# Patient Record
Sex: Male | Born: 1975
Health system: Southern US, Community
[De-identification: ages and names within clinical notes are randomized; demographics above are authoritative.]

## PROBLEM LIST (undated history)

## (undated) DIAGNOSIS — D571 Sickle-cell disease without crisis: Secondary | ICD-10-CM

## (undated) DIAGNOSIS — Z5189 Encounter for other specified aftercare: Secondary | ICD-10-CM

## (undated) DIAGNOSIS — I10 Essential (primary) hypertension: Secondary | ICD-10-CM

## (undated) HISTORY — DX: Encounter for other specified aftercare: Z51.89

---

## 2014-01-08 ENCOUNTER — Encounter (HOSPITAL_COMMUNITY): Payer: Self-pay | Admitting: Emergency Medicine

## 2014-01-08 ENCOUNTER — Emergency Department (INDEPENDENT_AMBULATORY_CARE_PROVIDER_SITE_OTHER)
Admission: EM | Admit: 2014-01-08 | Discharge: 2014-01-08 | Disposition: A | Discharge status: Home / Self Care | Payer: Self-pay | Source: Home / Self Care

## 2014-01-08 DIAGNOSIS — J029 Acute pharyngitis, unspecified: Secondary | ICD-10-CM

## 2014-01-08 HISTORY — DX: Sickle-cell disease without crisis: D57.1

## 2014-01-08 LAB — POCT RAPID STREP A: STREPTOCOCCUS, GROUP A SCREEN (DIRECT): NEGATIVE

## 2014-01-08 MED ORDER — AMOXICILLIN 500 MG PO CAPS: 1000.0000 mg | ORAL_CAPSULE | Freq: Two times a day (BID) | ORAL | Status: DC

## 2014-01-08 NOTE — ED Provider Notes (Signed)
CSN: [id number]     Arrival date & time 01/08/14  1540 History   First MD Initiated Contact with Patient 01/08/14 1737     Chief Complaint  Patient presents with  . Sore Throat   (Consider location/radiation/quality/duration/timing/severity/associated sxs/prior Treatment) HPI Comments: Sore throat for 3 days.   Past Medical History  Diagnosis Date  . Sickle cell anemia    History reviewed. No pertinent past surgical history. History reviewed. No pertinent family history. History  Substance Use Topics  . Smoking status: Not on file  . Smokeless tobacco: Not on file  . Alcohol Use: No    Review of Systems  Constitutional: Positive for activity change, appetite change and fatigue. Negative for fever.  HENT: Positive for sore throat. Negative for congestion, ear pain, postnasal drip and trouble swallowing.   Respiratory: Negative.   Cardiovascular: Negative.   Gastrointestinal: Negative.   Genitourinary: Negative.   Skin: Negative for rash.  Neurological: Negative.     Allergies  Review of patient's allergies indicates no known allergies.  Home Medications   Prior to Admission medications   Not on File   BP 125/85  Pulse 70  Temp(Src) 98.9 F (37.2 C) (Oral)  Resp 14  SpO2 98% Physical Exam  Nursing note and vitals reviewed. Constitutional: He is oriented to person, place, and time. He appears well-developed and well-nourished. No distress.  HENT:  Mouth/Throat: No oropharyngeal exudate.  Bilateral TMs are normal Oropharynx is beefy red with mild swelling particularly of the left palatine arch. no evidence of abscess formation.  Eyes: Conjunctivae and EOM are normal.  Neck: Normal range of motion. Neck supple.  Cardiovascular: Normal rate and normal heart sounds.   Pulmonary/Chest: Effort normal and breath sounds normal. No respiratory distress.  Musculoskeletal: He exhibits no edema.  Lymphadenopathy:    He has cervical adenopathy.  Neurological: He is  alert and oriented to person, place, and time.  Skin: Skin is warm and dry.  Psychiatric: He has a normal mood and affect.    ED Course  Procedures (including critical care time) Labs Review Labs Reviewed  POCT RAPID STREP A ([place])    Results for orders placed during the hospital encounter of 01/08/14  POCT RAPID STREP A ([facility] ONLY)      Result Value Ref Range   Streptococcus, Group A Screen (Direct) NEGATIVE  NEGATIVE   Imaging Review No results found.   MDM   1. Pharyngitis     Suspect strep Amoxil 1 gm bid Ibuprofen 600 q 6h prn Plenty of fluids cepacol loz    Janne Napoleon, NP 01/08/14 1802

## 2014-01-08 NOTE — ED Notes (Signed)
Pt  Reports  sorethroat            With  Symptoms   X  3  Days  Reports  Hurts  To  Swallow  As  Well  -  Pt  Sitting  Upright  On  Exam table  Speaking in  Complete  sentances  And  Is  In no  Acute  Distress

## 2014-01-08 NOTE — Discharge Instructions (Signed)
Pharyngitis Pharyngitis is a sore throat (pharynx). There is redness, pain, and swelling of your throat. HOME CARE   Drink enough fluids to keep your pee (urine) clear or pale yellow.  Only take medicine as told by your doctor.  You may get sick again if you do not take medicine as told. Finish your medicines, even if you start to feel better.  Do not take aspirin.  Rest.  Rinse your mouth (gargle) with salt water ( tsp of salt per 1 qt of water) every 1 2 hours. This will help the pain.  If you are not at risk for choking, you can suck on hard candy or sore throat lozenges. GET HELP IF:  You have large, tender lumps on your neck.  You have a rash.  You cough up green, yellow-brown, or bloody spit. GET HELP RIGHT AWAY IF:   You have a stiff neck.  You drool or cannot swallow liquids.  You throw up (vomit) or are not able to keep medicine or liquids down.  You have very bad pain that does not go away with medicine.  You have problems breathing (not from a stuffy nose). MAKE SURE YOU:   Understand these instructions.  Will watch your condition.  Will get help right away if you are not doing well or get worse. Document Released: 02/24/2008 Document Revised: 06/28/2013 Document Reviewed: 05/15/2013 [facility] Patient Information 2014 [place].  Sore Throat A sore throat is pain, burning, irritation, or scratchiness of the throat. There is often pain or tenderness when swallowing or talking. A sore throat may be accompanied by other symptoms, such as coughing, sneezing, fever, and swollen neck glands. A sore throat is often the first sign of another sickness, such as a cold, flu, strep throat, or mononucleosis (commonly known as mono). Most sore throats go away without medical treatment. CAUSES  The most common causes of a sore throat include:  A viral infection, such as a cold, flu, or mono.  A bacterial infection, such as strep throat, tonsillitis, or whooping  cough.  Seasonal allergies.  Dryness in the air.  Irritants, such as smoke or pollution.  Gastroesophageal reflux disease (GERD). HOME CARE INSTRUCTIONS   Only take over-the-counter medicines as directed by your caregiver.  Drink enough fluids to keep your urine clear or pale yellow.  Rest as needed.  Try using throat sprays, lozenges, or sucking on hard candy to ease any pain (if older than 4 years or as directed).  Sip warm liquids, such as broth, herbal tea, or warm water with honey to relieve pain temporarily. You may also eat or drink cold or frozen liquids such as frozen ice pops.  Gargle with salt water (mix 1 tsp salt with 8 oz of water).  Do not smoke and avoid secondhand smoke.  Put a cool-mist humidifier in your bedroom at night to moisten the air. You can also turn on a hot shower and sit in the bathroom with the door closed for 5 10 minutes. SEEK IMMEDIATE MEDICAL CARE IF:  You have difficulty breathing.  You are unable to swallow fluids, soft foods, or your saliva.  You have increased swelling in the throat.  Your sore throat does not get better in 7 days.  You have nausea and vomiting.  You have a fever or persistent symptoms for more than 2 3 days.  You have a fever and your symptoms suddenly get worse. MAKE SURE YOU:   Understand these instructions.  Will watch your  condition.  Will get help right away if you are not doing well or get worse. Document Released: 10/15/2004 Document Revised: 08/24/2012 Document Reviewed: 05/15/2012 [facility] Patient Information 2014 [place], [place].  Strep Throat Strep throat is an infection of the throat caused by a bacteria named Streptococcus pyogenes. Your caregiver may call the infection streptococcal "tonsillitis" or "pharyngitis" depending on whether there are signs of inflammation in the tonsils or back of the throat. Strep throat is most common in children aged 5 15 years during the cold months of the year,  but it can occur in people of any age during any season. This infection is spread from person to person (contagious) through coughing, sneezing, or other close contact. SYMPTOMS   Fever or chills.  Painful, swollen, red tonsils or throat.  Pain or difficulty when swallowing.  White or yellow spots on the tonsils or throat.  Swollen, tender lymph nodes or "glands" of the neck or under the jaw.  Red rash all over the body (rare). DIAGNOSIS  Many different infections can cause the same symptoms. A test must be done to confirm the diagnosis so the right treatment can be given. A "rapid strep test" can help your caregiver make the diagnosis in a few minutes. If this test is not available, a light swab of the infected area can be used for a throat culture test. If a throat culture test is done, results are usually available in a day or two. TREATMENT  Strep throat is treated with antibiotic medicine. HOME CARE INSTRUCTIONS   Gargle with 1 tsp of salt in 1 cup of warm water, 3 4 times per day or as needed for comfort.  Family members who also have a sore throat or fever should be tested for strep throat and treated with antibiotics if they have the strep infection.  Make sure everyone in your household washes their hands well.  Do not share food, drinking cups, or personal items that could cause the infection to spread to others.  You may need to eat a soft food diet until your sore throat gets better.  Drink enough water and fluids to keep your urine clear or pale yellow. This will help prevent dehydration.  Get plenty of rest.  Stay home from school, daycare, or work until you have been on antibiotics for 24 hours.  Only take over-the-counter or prescription medicines for pain, discomfort, or fever as directed by your caregiver.  If antibiotics are prescribed, take them as directed. Finish them even if you start to feel better. SEEK MEDICAL CARE IF:   The glands in your neck  continue to enlarge.  You develop a rash, cough, or earache.  You cough up green, yellow-brown, or bloody sputum.  You have pain or discomfort not controlled by medicines.  Your problems seem to be getting worse rather than better. SEEK IMMEDIATE MEDICAL CARE IF:   You develop any new symptoms such as vomiting, severe headache, stiff or painful neck, chest pain, shortness of breath, or trouble swallowing.  You develop severe throat pain, drooling, or changes in your voice.  You develop swelling of the neck, or the skin on the neck becomes red and tender.  You have a fever.  You develop signs of dehydration, such as fatigue, dry mouth, and decreased urination.  You become increasingly sleepy, or you cannot wake up completely. Document Released: 09/04/2000 Document Revised: 08/24/2012 Document Reviewed: 11/06/2010 [facility] Patient Information 2014 [place], [place].

## 2014-01-11 LAB — CULTURE, GROUP A STREP

## 2014-01-12 NOTE — ED Provider Notes (Signed)
Medical screening examination/treatment/procedure(s) were performed by resident physician or non-physician practitioner and as supervising physician I was immediately available for consultation/collaboration.   Pauline Good MD.   Billy Fischer, MD 01/12/14 [phone]

## 2015-01-28 ENCOUNTER — Encounter (HOSPITAL_BASED_OUTPATIENT_CLINIC_OR_DEPARTMENT_OTHER): Payer: Self-pay | Admitting: *Deleted

## 2015-01-28 DIAGNOSIS — Y998 Other external cause status: Secondary | ICD-10-CM | POA: Insufficient documentation

## 2015-01-28 DIAGNOSIS — W01198A Fall on same level from slipping, tripping and stumbling with subsequent striking against other object, initial encounter: Secondary | ICD-10-CM | POA: Insufficient documentation

## 2015-01-28 DIAGNOSIS — S0181XA Laceration without foreign body of other part of head, initial encounter: Secondary | ICD-10-CM | POA: Insufficient documentation

## 2015-01-28 DIAGNOSIS — Z862 Personal history of diseases of the blood and blood-forming organs and certain disorders involving the immune mechanism: Secondary | ICD-10-CM | POA: Insufficient documentation

## 2015-01-28 DIAGNOSIS — Y9289 Other specified places as the place of occurrence of the external cause: Secondary | ICD-10-CM | POA: Insufficient documentation

## 2015-01-28 DIAGNOSIS — Y9389 Activity, other specified: Secondary | ICD-10-CM | POA: Insufficient documentation

## 2015-01-28 DIAGNOSIS — Z720 Tobacco use: Secondary | ICD-10-CM | POA: Insufficient documentation
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
## 2015-01-29 ENCOUNTER — Encounter (HOSPITAL_BASED_OUTPATIENT_CLINIC_OR_DEPARTMENT_OTHER): Payer: Self-pay | Admitting: Emergency Medicine

## 2015-01-29 ENCOUNTER — Emergency Department (HOSPITAL_BASED_OUTPATIENT_CLINIC_OR_DEPARTMENT_OTHER)
Admission: EM | Admit: 2015-01-29 | Discharge: 2015-01-29 | Disposition: A | Discharge status: Home / Self Care | Payer: Self-pay | Attending: Emergency Medicine | Admitting: Emergency Medicine

## 2015-01-29 DIAGNOSIS — S0181XA Laceration without foreign body of other part of head, initial encounter: Secondary | ICD-10-CM

## 2015-01-29 MED ORDER — IBUPROFEN 600 MG PO TABS: 600.0000 mg | ORAL_TABLET | Freq: Three times a day (TID) | ORAL | Status: DC | PRN

## 2015-01-29 MED ORDER — KETOROLAC TROMETHAMINE 60 MG/2ML IM SOLN
60.0000 mg | Freq: Once | INTRAMUSCULAR | Status: AC
  Administered 2015-01-29: 60 mg via INTRAMUSCULAR
  Filled 2015-01-29: qty 2

## 2015-01-29 MED ORDER — CEPHALEXIN 500 MG PO CAPS: 500.0000 mg | ORAL_CAPSULE | Freq: Four times a day (QID) | ORAL | Status: DC
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
## 2017-09-07 ENCOUNTER — Encounter (HOSPITAL_COMMUNITY): Payer: Self-pay | Admitting: Emergency Medicine

## 2017-09-07 ENCOUNTER — Other Ambulatory Visit: Payer: Self-pay

## 2017-09-07 DIAGNOSIS — R42 Dizziness and giddiness: Secondary | ICD-10-CM | POA: Insufficient documentation

## 2017-09-07 DIAGNOSIS — Z5321 Procedure and treatment not carried out due to patient leaving prior to being seen by health care provider: Secondary | ICD-10-CM | POA: Insufficient documentation

## 2017-09-07 DIAGNOSIS — R51 Headache: Secondary | ICD-10-CM | POA: Diagnosis present

## 2017-09-07 MED ORDER — OXYCODONE-ACETAMINOPHEN 5-325 MG PO TABS
1.0000 | ORAL_TABLET | ORAL | Status: DC | PRN
  Administered 2017-09-07: 1 via ORAL
  Filled 2017-09-07: qty 1
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
## 2017-09-08 ENCOUNTER — Encounter (HOSPITAL_BASED_OUTPATIENT_CLINIC_OR_DEPARTMENT_OTHER): Payer: Self-pay

## 2017-09-08 ENCOUNTER — Emergency Department (HOSPITAL_BASED_OUTPATIENT_CLINIC_OR_DEPARTMENT_OTHER): Payer: BLUE CROSS/BLUE SHIELD

## 2017-09-08 ENCOUNTER — Other Ambulatory Visit: Payer: Self-pay

## 2017-09-08 ENCOUNTER — Emergency Department (HOSPITAL_COMMUNITY)
Admission: EM | Admit: 2017-09-08 | Discharge: 2017-09-08 | Disposition: A | Discharge status: Home / Self Care | Payer: BLUE CROSS/BLUE SHIELD | Attending: Emergency Medicine | Admitting: Emergency Medicine

## 2017-09-08 ENCOUNTER — Emergency Department (HOSPITAL_BASED_OUTPATIENT_CLINIC_OR_DEPARTMENT_OTHER)
Admission: EM | Admit: 2017-09-08 | Discharge: 2017-09-08 | Disposition: A | Discharge status: Home / Self Care | Payer: BLUE CROSS/BLUE SHIELD | Attending: Emergency Medicine | Admitting: Emergency Medicine

## 2017-09-08 DIAGNOSIS — S0181XA Laceration without foreign body of other part of head, initial encounter: Secondary | ICD-10-CM | POA: Insufficient documentation

## 2017-09-08 DIAGNOSIS — W228XXA Striking against or struck by other objects, initial encounter: Secondary | ICD-10-CM | POA: Diagnosis not present

## 2017-09-08 DIAGNOSIS — F17210 Nicotine dependence, cigarettes, uncomplicated: Secondary | ICD-10-CM | POA: Diagnosis not present

## 2017-09-08 DIAGNOSIS — Y999 Unspecified external cause status: Secondary | ICD-10-CM | POA: Insufficient documentation

## 2017-09-08 DIAGNOSIS — Y939 Activity, unspecified: Secondary | ICD-10-CM | POA: Insufficient documentation

## 2017-09-08 DIAGNOSIS — Y929 Unspecified place or not applicable: Secondary | ICD-10-CM | POA: Insufficient documentation

## 2017-09-08 DIAGNOSIS — S0990XA Unspecified injury of head, initial encounter: Secondary | ICD-10-CM | POA: Diagnosis present

## 2017-09-08 MED ORDER — ACETAMINOPHEN 500 MG PO TABS
1000.0000 mg | ORAL_TABLET | Freq: Once | ORAL | Status: AC
  Administered 2017-09-08: 1000 mg via ORAL
  Filled 2017-09-08: qty 2

## 2017-09-08 MED ORDER — IBUPROFEN 800 MG PO TABS
800.0000 mg | ORAL_TABLET | Freq: Once | ORAL | Status: AC
  Administered 2017-09-08: 800 mg via ORAL
  Filled 2017-09-08: qty 1

## 2017-09-08 MED ORDER — LIDOCAINE-EPINEPHRINE 2 %-1:100000 IJ SOLN
30.0000 mL | Freq: Once | INTRAMUSCULAR | Status: AC
  Administered 2017-09-08: 30 mL
  Filled 2017-09-08: qty 2

## 2017-09-08 MED ORDER — AMOXICILLIN-POT CLAVULANATE 875-125 MG PO TABS: 1.0000 | ORAL_TABLET | Freq: Two times a day (BID) | ORAL | 0 refills | Status: DC

## 2017-09-08 NOTE — ED Notes (Signed)
Pt resting while awaiting suturing

## 2017-09-08 NOTE — ED Notes (Signed)
Applied bacitracin, non-adhesive, bulky dressing and gave pt dressing supplies.  Pt given work note for Wednesday and Thursday, but wears safety glasses at work that will rest right on the wound, advised to call if work note needs adjusting.  Pt and significant other verbalize understanding of dc instructions and deny any further needs at this time
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
## 2017-09-08 NOTE — ED Notes (Signed)
[facility] called, pt there

## 2018-04-18 ENCOUNTER — Other Ambulatory Visit: Payer: Self-pay

## 2018-04-18 ENCOUNTER — Inpatient Hospital Stay (HOSPITAL_BASED_OUTPATIENT_CLINIC_OR_DEPARTMENT_OTHER)
Admission: EM | Admit: 2018-04-18 | Discharge: 2018-04-20 | DRG: 175 | Disposition: A | Discharge status: Home / Self Care | Payer: Medicaid Other | Attending: Internal Medicine | Admitting: Internal Medicine

## 2018-04-18 ENCOUNTER — Encounter (HOSPITAL_BASED_OUTPATIENT_CLINIC_OR_DEPARTMENT_OTHER): Payer: Self-pay | Admitting: *Deleted

## 2018-04-18 ENCOUNTER — Emergency Department (HOSPITAL_BASED_OUTPATIENT_CLINIC_OR_DEPARTMENT_OTHER): Payer: Medicaid Other

## 2018-04-18 DIAGNOSIS — I2699 Other pulmonary embolism without acute cor pulmonale: Secondary | ICD-10-CM | POA: Diagnosis present

## 2018-04-18 DIAGNOSIS — D5700 Hb-SS disease with crisis, unspecified: Secondary | ICD-10-CM | POA: Diagnosis present

## 2018-04-18 DIAGNOSIS — F17210 Nicotine dependence, cigarettes, uncomplicated: Secondary | ICD-10-CM | POA: Diagnosis present

## 2018-04-18 DIAGNOSIS — D5701 Hb-SS disease with acute chest syndrome: Secondary | ICD-10-CM

## 2018-04-18 DIAGNOSIS — I10 Essential (primary) hypertension: Secondary | ICD-10-CM

## 2018-04-18 DIAGNOSIS — D72829 Elevated white blood cell count, unspecified: Secondary | ICD-10-CM

## 2018-04-18 DIAGNOSIS — F17200 Nicotine dependence, unspecified, uncomplicated: Secondary | ICD-10-CM

## 2018-04-18 HISTORY — DX: Essential (primary) hypertension: I10

## 2018-04-18 LAB — CBC WITH DIFFERENTIAL/PLATELET
BASOS ABS: 0 10*3/uL (ref 0.0–0.1)
BASOS PCT: 0 %
Eosinophils Absolute: 0.1 10*3/uL (ref 0.0–0.7)
Eosinophils Relative: 0 %
HCT: 34.6 % — ABNORMAL LOW (ref 39.0–52.0)
Hemoglobin: 12.9 g/dL — ABNORMAL LOW (ref 13.0–17.0)
Lymphocytes Relative: 11 %
Lymphs Abs: 1.9 10*3/uL (ref 0.7–4.0)
MCH: 27.3 pg (ref 26.0–34.0)
MCHC: 37.3 g/dL — AB (ref 30.0–36.0)
MCV: 73.3 fL — ABNORMAL LOW (ref 78.0–100.0)
Monocytes Absolute: 1.6 10*3/uL — ABNORMAL HIGH (ref 0.1–1.0)
Monocytes Relative: 10 %
NEUTROS PCT: 79 %
Neutro Abs: 13.4 10*3/uL — ABNORMAL HIGH (ref 1.7–7.7)
Platelets: 407 10*3/uL — ABNORMAL HIGH (ref 150–400)
RBC: 4.72 MIL/uL (ref 4.22–5.81)
RDW: 14.4 % (ref 11.5–15.5)
WBC: 17 10*3/uL — AB (ref 4.0–10.5)

## 2018-04-18 LAB — BASIC METABOLIC PANEL
Anion gap: 9 (ref 5–15)
BUN: 8 mg/dL (ref 6–20)
CHLORIDE: 108 mmol/L (ref 98–111)
CO2: 24 mmol/L (ref 22–32)
CREATININE: 1.19 mg/dL (ref 0.61–1.24)
Calcium: 8.8 mg/dL — ABNORMAL LOW (ref 8.9–10.3)
GFR calc Af Amer: >60 mL/min (ref >60)
GFR calc non Af Amer: >60 mL/min (ref >60)
Glucose, Bld: 111 mg/dL — ABNORMAL HIGH (ref 70–99)
POTASSIUM: 3.4 mmol/L — AB (ref 3.5–5.1)
Sodium: 141 mmol/L (ref 135–145)

## 2018-04-18 LAB — RETICULOCYTES
RBC.: 4.84 MIL/uL (ref 4.22–5.81)
RETIC COUNT ABSOLUTE: 96.8 10*3/uL (ref 19.0–186.0)
Retic Ct Pct: 2 % (ref 0.4–3.1)

## 2018-04-18 LAB — CBC
HEMATOCRIT: 35.5 % — AB (ref 39.0–52.0)
HEMOGLOBIN: 12.8 g/dL — AB (ref 13.0–17.0)
MCH: 27.6 pg (ref 26.0–34.0)
MCHC: 36.1 g/dL — AB (ref 30.0–36.0)
MCV: 76.5 fL — ABNORMAL LOW (ref 78.0–100.0)
Platelets: 361 10*3/uL (ref 150–400)
RBC: 4.64 MIL/uL (ref 4.22–5.81)
RDW: 14.9 % (ref 11.5–15.5)
WBC: 14.2 10*3/uL — ABNORMAL HIGH (ref 4.0–10.5)

## 2018-04-18 LAB — TROPONIN I: Troponin I: <0.03 ng/mL (ref <0.03)

## 2018-04-18 LAB — CREATININE, SERUM
Creatinine, Ser: 1.13 mg/dL (ref 0.61–1.24)
GFR calc Af Amer: >60 mL/min (ref >60)
GFR calc non Af Amer: >60 mL/min (ref >60)

## 2018-04-18 LAB — D-DIMER, QUANTITATIVE: D-Dimer, Quant: 0.7 ug/mL-FEU — ABNORMAL HIGH (ref 0.00–0.50)

## 2018-04-18 MED ORDER — ACETAMINOPHEN 325 MG PO TABS
650.0000 mg | ORAL_TABLET | Freq: Once | ORAL | Status: AC
  Administered 2018-04-18: 650 mg via ORAL
  Filled 2018-04-18: qty 2

## 2018-04-18 MED ORDER — ENOXAPARIN SODIUM 40 MG/0.4ML ~~LOC~~ SOLN
40.0000 mg | SUBCUTANEOUS | Status: DC
  Administered 2018-04-18 – 2018-04-19 (×2): 40 mg via SUBCUTANEOUS
  Filled 2018-04-18 (×2): qty 0.4

## 2018-04-18 MED ORDER — SODIUM CHLORIDE 0.9 % IV BOLUS
1000.0000 mL | Freq: Once | INTRAVENOUS | Status: AC
  Administered 2018-04-18: 1000 mL via INTRAVENOUS

## 2018-04-18 MED ORDER — ENOXAPARIN SODIUM 40 MG/0.4ML ~~LOC~~ SOLN: 40.0000 mg | SUBCUTANEOUS | Status: DC

## 2018-04-18 MED ORDER — POLYETHYLENE GLYCOL 3350 17 G PO PACK: 17.0000 g | PACK | Freq: Every day | ORAL | Status: DC | PRN

## 2018-04-18 MED ORDER — FOLIC ACID 1 MG PO TABS
1.0000 mg | ORAL_TABLET | Freq: Every day | ORAL | Status: DC
  Administered 2018-04-18 – 2018-04-20 (×3): 1 mg via ORAL
  Filled 2018-04-18 (×3): qty 1

## 2018-04-18 MED ORDER — IOPAMIDOL (ISOVUE-370) INJECTION 76%
100.0000 mL | Freq: Once | INTRAVENOUS | Status: AC | PRN
  Administered 2018-04-18: 62 mL via INTRAVENOUS

## 2018-04-18 MED ORDER — SODIUM CHLORIDE 0.45 % IV SOLN
INTRAVENOUS | Status: DC
  Administered 2018-04-18 – 2018-04-20 (×3): via INTRAVENOUS

## 2018-04-18 MED ORDER — SODIUM CHLORIDE 0.9 % IV SOLN
1.0000 g | Freq: Once | INTRAVENOUS | Status: AC
  Administered 2018-04-18: 1 g via INTRAVENOUS
  Filled 2018-04-18: qty 10

## 2018-04-18 MED ORDER — MORPHINE SULFATE (PF) 4 MG/ML IV SOLN
4.0000 mg | Freq: Once | INTRAVENOUS | Status: AC
  Administered 2018-04-18: 4 mg via INTRAVENOUS
  Filled 2018-04-18: qty 1

## 2018-04-18 MED ORDER — NICOTINE 21 MG/24HR TD PT24
21.0000 mg | MEDICATED_PATCH | Freq: Every day | TRANSDERMAL | Status: DC
  Administered 2018-04-18 – 2018-04-20 (×3): 21 mg via TRANSDERMAL
  Filled 2018-04-18 (×3): qty 1

## 2018-04-18 MED ORDER — SODIUM CHLORIDE 0.9 % IV SOLN
500.0000 mg | INTRAVENOUS | Status: DC
  Administered 2018-04-19: 500 mg via INTRAVENOUS
  Filled 2018-04-18: qty 500

## 2018-04-18 MED ORDER — OXYCODONE HCL 5 MG PO TABS
5.0000 mg | ORAL_TABLET | ORAL | Status: DC | PRN
  Administered 2018-04-18 – 2018-04-19 (×4): 5 mg via ORAL
  Filled 2018-04-18 (×5): qty 1

## 2018-04-18 MED ORDER — SODIUM CHLORIDE 0.9 % IV SOLN
1.0000 g | INTRAVENOUS | Status: DC
  Administered 2018-04-19 – 2018-04-20 (×2): 1 g via INTRAVENOUS
  Filled 2018-04-18: qty 1
  Filled 2018-04-18: qty 10

## 2018-04-18 MED ORDER — AZITHROMYCIN 500 MG IV SOLR
INTRAVENOUS | Status: AC
  Filled 2018-04-18: qty 500

## 2018-04-18 MED ORDER — PNEUMOCOCCAL VAC POLYVALENT 25 MCG/0.5ML IJ INJ
0.5000 mL | INJECTION | INTRAMUSCULAR | Status: DC
  Filled 2018-04-18: qty 0.5

## 2018-04-18 MED ORDER — ONDANSETRON HCL 4 MG/2ML IJ SOLN
4.0000 mg | Freq: Once | INTRAMUSCULAR | Status: AC
  Administered 2018-04-18: 4 mg via INTRAVENOUS
  Filled 2018-04-18: qty 2

## 2018-04-18 MED ORDER — MORPHINE SULFATE (PF) 4 MG/ML IV SOLN
4.0000 mg | INTRAVENOUS | Status: DC | PRN
  Administered 2018-04-18: 4 mg via INTRAVENOUS
  Filled 2018-04-18: qty 1

## 2018-04-18 MED ORDER — SODIUM CHLORIDE 0.9 % IV SOLN
INTRAVENOUS | Status: DC | PRN
  Administered 2018-04-18: 1000 mL via INTRAVENOUS

## 2018-04-18 MED ORDER — KETOROLAC TROMETHAMINE 15 MG/ML IJ SOLN
15.0000 mg | Freq: Four times a day (QID) | INTRAMUSCULAR | Status: DC
  Administered 2018-04-18 – 2018-04-20 (×8): 15 mg via INTRAVENOUS
  Filled 2018-04-18 (×8): qty 1

## 2018-04-18 MED ORDER — SODIUM CHLORIDE 0.9 % IV SOLN
500.0000 mg | Freq: Once | INTRAVENOUS | Status: AC
  Administered 2018-04-18: 500 mg via INTRAVENOUS
  Filled 2018-04-18: qty 500

## 2018-04-18 MED ORDER — SODIUM CHLORIDE 0.9 % IV SOLN
INTRAVENOUS | Status: DC | PRN
  Administered 2018-04-18: 500 mL via INTRAVENOUS

## 2018-04-18 NOTE — H&P (Signed)
H&P  Patient Demographics:  Ronald Hurst, is a 42 y.o. male  MRN: [id number]   DOB - Aug 05, 1976  Admit Date - 04/18/2018  Outpatient Primary MD for the patient is Patient, No Pcp Per  Chief Complaint  Patient presents with  . Chest Pain      HPI:   Ronald Hurst  is a 42 y.o. male with a medical history significant for sickle cell anemia and tobacco dependence presents complaining of left chest and left upper extremity pain for 2 days. Patient says that he returned from vacation in [place], [place] 2 days ago and noticed that chest pain was worsening with deep breathing following 12 hour car ride. Pain intensity 5-6/10 characterized as intermittent and aching. Patient typically takes Ibuprofen for sickle cell pain and is not opiate tolerant. Patient has sickle cell crisis infrequently and does not have a PCP. He currently denies shortness of breath, heart palpitations, fatigue, dysuria, nausea, vomiting, or diarrhea.   ER course:  Patient presented to [facility] with chest pain. Patient treated with Morphine IV and IVF, pain improved moderately. Patient underwent CT angiogram, which showed no pulmonary embolism to the segmental level. Also, a peripheral lingular opacity suggesting pulmonary infarxt and dependent left lower opacity. Patient administered Rocephin and azithromycin. Blood cultures were obtained. Oxygen saturation was 90-91% on room air. Temperature 100.3, mild tachycardia, WBC count 17. Hemoglobin 12.9, with an unknown baseline. Patient is opiate tolerant.    Review of systems:  In addition to the HPI above, patient reports No fever or chills No Headache, No changes with vision or hearing No problems swallowing food or liquids No chest pain, cough or shortness of breath No Abdominal pain, No Nausea or Vomiting, Bowel movements are regular No blood in stool or urine No dysuria No new skin rashes or bruises No new joints pains-aches No new weakness,  tingling, numbness in any extremity No recent weight gain or loss No polyuria, polydypsia or polyphagia No significant Mental Stressors  A full 10 point Review of Systems was done, except as stated above, all other Review of Systems were negative.  With Past History of the following :   Past Medical History:  Diagnosis Date  . Sickle cell anemia (HCC)       History reviewed. No pertinent surgical history.   Social History:   Social History   Tobacco Use  . Smoking status: Current Every Day Smoker    Packs/day: 0.50    Types: Cigarettes  . Smokeless tobacco: Never Used  Substance Use Topics  . Alcohol use: Yes     Lives - At home   Family History :   No family history on file.   Home Medications:   Prior to Admission medications   Medication Sig Start Date End Date Taking? Authorizing Provider  amoxicillin-clavulanate (AUGMENTIN) 875-125 MG tablet Take 1 tablet by mouth 2 (two) times daily. One po bid x 7 days 09/08/17   Palumbo, April, MD  cephALEXin (KEFLEX) 500 MG capsule Take 1 capsule (500 mg total) by mouth 4 (four) times daily. 01/29/15   Palumbo, April, MD  ibuprofen (ADVIL,MOTRIN) 600 MG tablet Take 1 tablet (600 mg total) by mouth every 8 (eight) hours as needed for moderate pain. 01/29/15   Palumbo, April, MD     Allergies:   No Known Allergies   Physical Exam:   Vitals:   Vitals:   04/18/18 0556 04/18/18 0631  BP: 125/86 119/90  Pulse: 86 73  Resp: 18  16  Temp: 98.3 F (36.8 C) 98.5 F (36.9 C)  SpO2: 100% 97%    Physical Exam: Constitutional: Patient appears well-developed and well-nourished. Not in obvious distress. HENT: Normocephalic, atraumatic, External right and left ear normal. Oropharynx is clear and moist.  Eyes: Conjunctivae and EOM are normal. PERRLA, no scleral icterus. Neck: Normal ROM. Neck supple. No JVD. No tracheal deviation. No thyromegaly. CVS: RRR, S1/S2 +, no murmurs, no gallops, no carotid bruit.  Pulmonary: Effort  and breath sounds normal, no stridor, rhonchi, wheezes, rales.  Abdominal: Soft. BS +, no distension, tenderness, rebound or guarding.  Musculoskeletal: Normal range of motion. No edema and no tenderness.  Lymphadenopathy: No lymphadenopathy noted, cervical, inguinal or axillary Neuro: Alert. Normal reflexes, muscle tone coordination. No cranial nerve deficit. Skin: Skin is warm and dry. No rash noted. Not diaphoretic. No erythema. No pallor. Psychiatric: Normal mood and affect. Behavior, judgment, thought content normal.   Data Review:   CBC Recent Labs  Lab 04/18/18 0208  WBC 17.0*  HGB 12.9*  HCT 34.6*  PLT 407*  MCV 73.3*  MCH 27.3  MCHC 37.3*  RDW 14.4  LYMPHSABS 1.9  MONOABS 1.6*  EOSABS 0.1  BASOSABS 0.0   ------------------------------------------------------------------------------------------------------------------  Chemistries  Recent Labs  Lab 04/18/18 0208  NA 141  K 3.4*  CL 108  CO2 24  GLUCOSE 111*  BUN 8  CREATININE 1.19  CALCIUM 8.8*   ------------------------------------------------------------------------------------------------------------------ estimated creatinine clearance is 80 mL/min (by C-G formula based on SCr of 1.19 mg/dL). ------------------------------------------------------------------------------------------------------------------ No results for input(s): TSH, T4TOTAL, T3FREE, THYROIDAB in the last 72 hours.  Invalid input(s): FREET3  Coagulation profile No results for input(s): INR, PROTIME in the last 168 hours. ------------------------------------------------------------------------------------------------------------------- Recent Labs    04/18/18 0208  DDIMER 0.70*   -------------------------------------------------------------------------------------------------------------------  Cardiac Enzymes Recent Labs  Lab 04/18/18 0208  TROPONINI <0.03    ------------------------------------------------------------------------------------------------------------------ No results found for: BNP  ---------------------------------------------------------------------------------------------------------------  Urinalysis No results found for: COLORURINE, APPEARANCEUR, LABSPEC, PHURINE, GLUCOSEU, HGBUR, BILIRUBINUR, KETONESUR, PROTEINUR, UROBILINOGEN, NITRITE, LEUKOCYTESUR  ----------------------------------------------------------------------------------------------------------------   Imaging Results:    Dg Chest 2 View  Result Date: 04/18/2018 CLINICAL DATA:  Chest pain. EXAM: CHEST - 2 VIEW COMPARISON:  None. FINDINGS: Low lung volumes. Ill-defined streaky bibasilar opacities with more focal opacity in the lingula. The heart is normal in size. Normal mediastinal contours. No pulmonary edema, pleural effusion or pneumothorax. No acute osseous abnormalities. IMPRESSION: Low lung volumes. Streaky bibasilar opacities likely atelectasis. Slightly more focal opacity in the lingula may be pneumonia or acute chest syndrome in the setting of sickle cell anemia. Electronically Signed   By: Jeb Levering M.D.   On: 04/18/2018 02:08   Ct Angio Chest Pe W And/or Wo Contrast  Result Date: 04/18/2018 CLINICAL DATA:  Chest pain for 2 days. Recent car travel from [place]. History of sickle cell. PE suspected, intermediate prob, positive D-dimer EXAM: CT ANGIOGRAPHY CHEST WITH CONTRAST TECHNIQUE: Multidetector CT imaging of the chest was performed using the standard protocol during bolus administration of intravenous contrast. Multiplanar CT image reconstructions and MIPs were obtained to evaluate the vascular anatomy. CONTRAST:  20mL ISOVUE-370 IOPAMIDOL (ISOVUE-370) INJECTION 76% COMPARISON:  Chest radiograph earlier this day FINDINGS: Cardiovascular: No filling defects in the pulmonary arteries to the segmental level to suggest pulmonary embolus. Subsegmental  branches cannot be assessed due to contrast bolus timing. Thoracic aorta is normal in caliber without dissection. Left vertebral artery arises directly from the aorta, normal variant. Normal heart size. No pericardial effusion. Mediastinum/Nodes:  Small mediastinal and bilateral hilar nodes not enlarged by size criteria, likely reactive. The esophagus is decompressed. No thyroid nodule. Lungs/Pleura: Peripheral opacity abutting the pleural in the lingula suggestive of pulmonary infarct. Left lower lobe opacity dependently may be infarct or atelectasis. Scattered subsegmental atelectasis in the right lower and middle lobe. Trace left pleural thickening. Upper Abdomen: Small spleen in keeping with sickle cell disease. Low-density lesion in the left kidney likely cysts but incompletely included in the field of view. Vague 18 mm low-density lesion in the right dome of the liver which is described on abdominal CT 05/18/2012, images not available. Musculoskeletal: There are no acute or suspicious osseous abnormalities. Review of the MIP images confirms the above findings. IMPRESSION: 1. No pulmonary embolus to the segmental level. 2. Peripheral lingular opacity suggesting pulmonary infarct. Dependent left lower lobe opacity may be atelectasis or additional infarct. Scattered atelectasis throughout both lungs. 3. Vague low-density lesion in the right lobe of the liver, incompletely characterized. This was described on abdominal CT of 05/18/2012, however images not available for direct comparison. Recommend nonemergent MRI characterization if not performed in the interim. Electronically Signed   By: Jeb Levering M.D.   On: 04/18/2018 04:16      Assessment & Plan:  Active Problems:   Pulmonary infarct (HCC)   Acute chest syndrome (HCC)   Tobacco dependence   Sickle cell crisis ([place])   1. Pneumonia vs. Acute chest syndrome:  Admit. Peripheral lingular opacity suggesting pulmonary infarct. Dependent left lower  lobe opacity may be atelectasis or additional infarct. Scattered atelectasis throughout both lungs. Will continue Azithromycin and Rocephin. WBC count 17, will repeat CBC in am.    2. Hb Sickle Cell Disease with crisis:  Admit, start IVF 0 .45% Saline @ 75 mls/hour, Patient opiate naive. Morphine 4 mg IV every 4 hours prn for severe pain. Oxycodone IR 5 mg every 4 hours has needed for moderate pain.   IV Toradol 30 mg Q 6 H, Monitor vitals very closely, Re-evaluate pain scale regularly,  2 L of Oxygen by [place], Patient will be re-evaluated for pain in the context of function and relationship to baseline as care progresses. Patient unsure of sickle cell genotype, will review hemoglobinopathy has results become available.   3. Leukocytosis:  WBC 17,000, will repeat CBC in am.   4. Sickle Cell Anemia:  Folic acid 1 mg  5. Tobacco dependence:  Nicotine patch   DVT Prophylaxis: Subcut Lovenox   AM Labs Ordered, also please review Full Orders  Family Communication: Admission, patient's condition and plan of care including tests being ordered have been discussed with the patient who indicate understanding and agree with the plan and Code Status.  Code Status: Full Code  Consults called: None    Admission status: Inpatient    Time spent in minutes : 50 minutes  [place], MSN, FNP-C [facility] [address] [place], [place] [address] [phone]  04/18/2018 at 12:10 PM

## 2018-04-18 NOTE — Plan of Care (Signed)
42 yo M with h/o 'sickle cell' though not on any meds, no h/o acute chest previously, ?heterozygous?Marland Kitchen  Presents to ED with CP.  Found to have pulmonary infarct of lingula on CTA chest (no PE).  Getting treated as acute chest / PNA.  Put on rocephin, azithro.  HGB 12.9 so no transfusion.  Satting 90-91% on RA.  Will send to tele, IP.

## 2018-04-18 NOTE — ED Notes (Signed)
Returned from CT.

## 2018-04-18 NOTE — ED Notes (Signed)
Attempted report x1. Gave callback number.   

## 2018-04-18 NOTE — ED Triage Notes (Addendum)
C/o dull anterior chest pain that radiates down his left arm that started sat night. C/o feeling a little sob. Has had a recent long car ride to [place] on Thursday. Drove down and back. Denies n/v. Took advil and states helped "a little" pt presents in no distress. Denies history of PE.

## 2018-04-18 NOTE — ED Notes (Addendum)
Patient transported to CT with RN 

## 2018-04-18 NOTE — ED Provider Notes (Signed)
[facility] EMERGENCY DEPARTMENT Provider Note   CSN: [id number] Arrival date & time: 04/18/18  0136     History   Chief Complaint Chief Complaint  Patient presents with  . Chest Pain    HPI Ronald Hurst is a 42 y.o. male.  HPI  This is a 42 year old male with a history of sickle cell anemia who presents with chest pain.  Patient reports chest pain onset on Saturday night.  It is left-sided and radiates into the left arm.  He reports that it is dull.  Does report some shortness of breath.  Denies any cough.  Denies any lower extremity swelling or history of PE.  He did take ibuprofen with "some relief."  Rates his pain at this time a 8 out of 10.  Denies any pleuritic or exertional component to the pain.  Nothing seems to really make it any worse.  He reports that his sickle cell disease is well controlled.  He does not take any daily medications and has not had a sickle cell crisis recently or history of acute chest.  Has noted chills but has not had any documented fevers at home.  No nausea, vomiting, abdominal pain.  Past Medical History:  Diagnosis Date  . Sickle cell anemia (HCC)     There are no active problems to display for this patient.   History reviewed. No pertinent surgical history.      Home Medications    Prior to Admission medications   Medication Sig Start Date End Date Taking? Authorizing Provider  amoxicillin-clavulanate (AUGMENTIN) 875-125 MG tablet Take 1 tablet by mouth 2 (two) times daily. One po bid x 7 days 09/08/17   Palumbo, April, MD  cephALEXin (KEFLEX) 500 MG capsule Take 1 capsule (500 mg total) by mouth 4 (four) times daily. 01/29/15   Palumbo, April, MD  ibuprofen (ADVIL,MOTRIN) 600 MG tablet Take 1 tablet (600 mg total) by mouth every 8 (eight) hours as needed for moderate pain. 01/29/15   Palumbo, April, MD    Family History No family history on file.  Social History Social History   Tobacco Use  . Smoking status:  Current Every Day Smoker    Packs/day: 0.50    Types: Cigarettes  . Smokeless tobacco: Never Used  Substance Use Topics  . Alcohol use: Yes  . Drug use: Yes    Types: Marijuana     Allergies   Patient has no known allergies.   Review of Systems Review of Systems  Constitutional: Positive for chills and fever.  Respiratory: Positive for shortness of breath. Negative for cough.   Cardiovascular: Positive for chest pain.  Gastrointestinal: Negative for abdominal pain and nausea.  Genitourinary: Negative for dysuria.  Skin: Negative for rash.  Neurological: Negative for headaches.  All other systems reviewed and are negative.    Physical Exam Updated Vital Signs BP 123/85   Pulse 88   Temp 100.3 F (37.9 C) (Oral)   Resp 16   Ht 5\' 9"  (1.753 m)   Wt 72.6 kg (160 lb)   SpO2 91%   BMI 23.63 kg/m   Physical Exam  Constitutional: He is oriented to person, place, and time. He appears well-developed and well-nourished.  HENT:  Head: Normocephalic and atraumatic.  Neck: Neck supple.  Cardiovascular: Regular rhythm, normal heart sounds and normal pulses. Tachycardia present.  No murmur heard. Pulmonary/Chest: Effort normal and breath sounds normal. No respiratory distress. He has no wheezes.  Abdominal: Soft. Bowel sounds are normal.  There is no tenderness. There is no rebound.  Musculoskeletal: He exhibits no edema.       Right lower leg: Normal. He exhibits no tenderness and no edema.       Left lower leg: Normal. He exhibits no tenderness and no edema.  Lymphadenopathy:    He has no cervical adenopathy.  Neurological: He is alert and oriented to person, place, and time.  Skin: Skin is warm. He is diaphoretic.  Psychiatric: He has a normal mood and affect.  Nursing note and vitals reviewed.    ED Treatments / Results  Labs (all labs ordered are listed, but only abnormal results are displayed) Labs Reviewed  CBC WITH DIFFERENTIAL/PLATELET - Abnormal; Notable for  the following components:      Result Value   WBC 17.0 (*)    Hemoglobin 12.9 (*)    HCT 34.6 (*)    MCV 73.3 (*)    MCHC 37.3 (*)    Platelets 407 (*)    Neutro Abs 13.4 (*)    Monocytes Absolute 1.6 (*)    All other components within normal limits  BASIC METABOLIC PANEL - Abnormal; Notable for the following components:   Potassium 3.4 (*)    Glucose, Bld 111 (*)    Calcium 8.8 (*)    All other components within normal limits  D-DIMER, QUANTITATIVE (NOT AT [facility]) - Abnormal; Notable for the following components:   D-Dimer, Quant 0.70 (*)    All other components within normal limits  CULTURE, BLOOD (ROUTINE X 2)  CULTURE, BLOOD (ROUTINE X 2)  TROPONIN I  RETICULOCYTES    EKG EKG Interpretation  Date/Time:  Monday April 18 2018 01:43:14 EDT Ventricular Rate:  109 PR Interval:    QRS Duration: 78 QT Interval:  317 QTC Calculation: 427 R Axis:   32 Text Interpretation:  Sinus tachycardia Probable left atrial enlargement LVH with secondary repolarization abnormality No prior for comparison Confirmed by Thayer Jew [phone]) on 04/18/2018 1:48:21 AM   Radiology Dg Chest 2 View  Result Date: 04/18/2018 CLINICAL DATA:  Chest pain. EXAM: CHEST - 2 VIEW COMPARISON:  None. FINDINGS: Low lung volumes. Ill-defined streaky bibasilar opacities with more focal opacity in the lingula. The heart is normal in size. Normal mediastinal contours. No pulmonary edema, pleural effusion or pneumothorax. No acute osseous abnormalities. IMPRESSION: Low lung volumes. Streaky bibasilar opacities likely atelectasis. Slightly more focal opacity in the lingula may be pneumonia or acute chest syndrome in the setting of sickle cell anemia. Electronically Signed   By: Jeb Levering M.D.   On: 04/18/2018 02:08   Ct Angio Chest Pe W And/or Wo Contrast  Result Date: 04/18/2018 CLINICAL DATA:  Chest pain for 2 days. Recent car travel from [place]. History of sickle cell. PE suspected, intermediate prob,  positive D-dimer EXAM: CT ANGIOGRAPHY CHEST WITH CONTRAST TECHNIQUE: Multidetector CT imaging of the chest was performed using the standard protocol during bolus administration of intravenous contrast. Multiplanar CT image reconstructions and MIPs were obtained to evaluate the vascular anatomy. CONTRAST:  79mL ISOVUE-370 IOPAMIDOL (ISOVUE-370) INJECTION 76% COMPARISON:  Chest radiograph earlier this day FINDINGS: Cardiovascular: No filling defects in the pulmonary arteries to the segmental level to suggest pulmonary embolus. Subsegmental branches cannot be assessed due to contrast bolus timing. Thoracic aorta is normal in caliber without dissection. Left vertebral artery arises directly from the aorta, normal variant. Normal heart size. No pericardial effusion. Mediastinum/Nodes: Small mediastinal and bilateral hilar nodes not enlarged by size criteria, likely reactive.  The esophagus is decompressed. No thyroid nodule. Lungs/Pleura: Peripheral opacity abutting the pleural in the lingula suggestive of pulmonary infarct. Left lower lobe opacity dependently may be infarct or atelectasis. Scattered subsegmental atelectasis in the right lower and middle lobe. Trace left pleural thickening. Upper Abdomen: Small spleen in keeping with sickle cell disease. Low-density lesion in the left kidney likely cysts but incompletely included in the field of view. Vague 18 mm low-density lesion in the right dome of the liver which is described on abdominal CT 05/18/2012, images not available. Musculoskeletal: There are no acute or suspicious osseous abnormalities. Review of the MIP images confirms the above findings. IMPRESSION: 1. No pulmonary embolus to the segmental level. 2. Peripheral lingular opacity suggesting pulmonary infarct. Dependent left lower lobe opacity may be atelectasis or additional infarct. Scattered atelectasis throughout both lungs. 3. Vague low-density lesion in the right lobe of the liver, incompletely  characterized. This was described on abdominal CT of 05/18/2012, however images not available for direct comparison. Recommend nonemergent MRI characterization if not performed in the interim. Electronically Signed   By: Jeb Levering M.D.   On: 04/18/2018 04:16    Procedures Procedures (including critical care time)  CRITICAL CARE Performed by: Merryl Hacker   Total critical care time: 45 minutes  Critical care time was exclusive of separately billable procedures and treating other patients.  Critical care was necessary to treat or prevent imminent or life-threatening deterioration.  Critical care was time spent personally by me on the following activities: development of treatment plan with patient and/or surrogate as well as nursing, discussions with consultants, evaluation of patient's response to treatment, examination of patient, obtaining history from patient or surrogate, ordering and performing treatments and interventions, ordering and review of laboratory studies, ordering and review of radiographic studies, pulse oximetry and re-evaluation of patient's condition.   Medications Ordered in ED Medications  cefTRIAXone (ROCEPHIN) 1 g in sodium chloride 0.9 % 100 mL IVPB (has no administration in time range)  azithromycin (ZITHROMAX) 500 mg in sodium chloride 0.9 % 250 mL IVPB (has no administration in time range)  acetaminophen (TYLENOL) tablet 650 mg (650 mg Oral Given 04/18/18 0220)  sodium chloride 0.9 % bolus 1,000 mL (0 mLs Intravenous Stopped 04/18/18 0350)  morphine 4 MG/ML injection 4 mg (4 mg Intravenous Given 04/18/18 0259)  ondansetron (ZOFRAN) injection 4 mg (4 mg Intravenous Given 04/18/18 0259)  iopamidol (ISOVUE-370) 76 % injection 100 mL (62 mLs Intravenous Contrast Given 04/18/18 0310)     Initial Impression / Assessment and Plan / ED Course  I have reviewed the triage vital signs and the nursing notes.  Pertinent labs & imaging results that were  available during my care of the patient were reviewed by me and considered in my medical decision making (see chart for details).     Patient presents with chest pain.  He is overall nontoxic-appearing on exam.  Initial vital signs notable for temperature of 100.3 and mild tachycardia.  O2 sats low 90s but patient is in no acute distress.  Lab work-up and chest x-ray initiated.  Patient was given morphine and Tylenol for pain.  Chest x-ray shows possible infiltrate in the lingula.  White count is 17.  Hemoglobin is 12.9 with an unknown baseline.  Given that he is well controlled with sickle cell and has a close to normal hemoglobin, makes me question whether he is homozygous or heterozygous for the gene.  Patient does not know.  D-dimer was sent given tachycardia  and is positive.  CT scan obtained.  This does not show any evidence of PE but does show likely a pulmonary infarct.  This makes me highly suspicious for acute chest syndrome.  Patient was given Rocephin and azithromycin.  Blood cultures were obtained.  He remains nontoxic-appearing and in no acute distress.  O2 sats 90-91%.  Patient placed on supplemental oxygen.  No indication for transfusion at this time.  Given concern for acute chest and possible worsening of condition, would admit for observation and IV antibiotics.  After history, exam, and medical workup I feel the patient has been appropriately medically screened and is safe for discharge home. Pertinent diagnoses were discussed with the patient. Patient was given return precautions.   Final Clinical Impressions(s) / ED Diagnoses   Final diagnoses:  Acute chest syndrome [facility])  Pulmonary infarct [facility])    ED Discharge Orders    None       Dina Rich, Barbette Hair, MD 04/18/18 0430

## 2018-04-18 NOTE — ED Notes (Signed)
Patient transported to X-ray 

## 2018-04-19 DIAGNOSIS — D72829 Elevated white blood cell count, unspecified: Secondary | ICD-10-CM

## 2018-04-19 LAB — CBC WITH DIFFERENTIAL/PLATELET
BASOS ABS: 0 10*3/uL (ref 0.0–0.1)
Basophils Relative: 0 %
Eosinophils Absolute: 0.2 10*3/uL (ref 0.0–0.7)
Eosinophils Relative: 2 %
HEMATOCRIT: 30.7 % — AB (ref 39.0–52.0)
Hemoglobin: 11 g/dL — ABNORMAL LOW (ref 13.0–17.0)
LYMPHS ABS: 3.2 10*3/uL (ref 0.7–4.0)
Lymphocytes Relative: 29 %
MCH: 27.3 pg (ref 26.0–34.0)
MCHC: 35.8 g/dL (ref 30.0–36.0)
MCV: 76.2 fL — ABNORMAL LOW (ref 78.0–100.0)
Monocytes Absolute: 1.7 10*3/uL — ABNORMAL HIGH (ref 0.1–1.0)
Monocytes Relative: 15 %
NEUTROS ABS: 6 10*3/uL (ref 1.7–7.7)
Neutrophils Relative %: 54 %
PLATELETS: 329 10*3/uL (ref 150–400)
RBC: 4.03 MIL/uL — ABNORMAL LOW (ref 4.22–5.81)
RDW: 14.7 % (ref 11.5–15.5)
WBC: 11.1 10*3/uL — ABNORMAL HIGH (ref 4.0–10.5)

## 2018-04-19 LAB — COMPREHENSIVE METABOLIC PANEL
ALT: 14 U/L (ref 0–44)
AST: 17 U/L (ref 15–41)
Albumin: 3.3 g/dL — ABNORMAL LOW (ref 3.5–5.0)
Alkaline Phosphatase: 49 U/L (ref 38–126)
Anion gap: 6 (ref 5–15)
BUN: 9 mg/dL (ref 6–20)
CO2: 26 mmol/L (ref 22–32)
Calcium: 8.3 mg/dL — ABNORMAL LOW (ref 8.9–10.3)
Chloride: 108 mmol/L (ref 98–111)
Creatinine, Ser: 1.06 mg/dL (ref 0.61–1.24)
GFR calc non Af Amer: >60 mL/min (ref >60)
Glucose, Bld: 106 mg/dL — ABNORMAL HIGH (ref 70–99)
Potassium: 3.7 mmol/L (ref 3.5–5.1)
Sodium: 140 mmol/L (ref 135–145)
Total Bilirubin: 1.2 mg/dL (ref 0.3–1.2)
Total Protein: 6.3 g/dL — ABNORMAL LOW (ref 6.5–8.1)

## 2018-04-19 LAB — HIV ANTIBODY (ROUTINE TESTING W REFLEX): HIV SCREEN 4TH GENERATION: NONREACTIVE

## 2018-04-19 MED ORDER — AMLODIPINE BESYLATE 5 MG PO TABS
5.0000 mg | ORAL_TABLET | Freq: Every day | ORAL | Status: DC
  Administered 2018-04-19 – 2018-04-20 (×2): 5 mg via ORAL
  Filled 2018-04-19 (×2): qty 1

## 2018-04-19 MED ORDER — AZITHROMYCIN 250 MG PO TABS
500.0000 mg | ORAL_TABLET | Freq: Every day | ORAL | Status: DC
  Administered 2018-04-20: 500 mg via ORAL
  Filled 2018-04-19: qty 2

## 2018-04-19 NOTE — Progress Notes (Signed)
BP remains elevated, NP extender updated, orders noted, pt education provided. SRP, RN

## 2018-04-19 NOTE — Progress Notes (Signed)
Pt BP elevated, NP-extender made aware, orders received to adjust IVF and recheck BP in 30 minutes and called report findings. SRP, RN

## 2018-04-19 NOTE — Progress Notes (Signed)
PHARMACIST - PHYSICIAN COMMUNICATION  CONCERNING: Antibiotic IV to Oral Route Change Policy  RECOMMENDATION: This patient is receiving azithromycin by the intravenous route.  Based on criteria approved by the Pharmacy and Therapeutics Committee, the antibiotic(s) is/are being converted to the equivalent oral dose form(s).   DESCRIPTION: These criteria include:  Patient being treated for a respiratory tract infection, urinary tract infection, cellulitis or clostridium difficile associated diarrhea if on metronidazole  The patient is not neutropenic and does not exhibit a GI malabsorption state  The patient is eating (either orally or via tube) and/or has been taking other orally administered medications for a least 24 hours  The patient is improving clinically and has a Tmax < 100.5  If you have questions about this conversion, please contact the Pharmacy Department  []   [phone] )  [facility] []   [phone] )  [facility] []   [phone] )  [facility] []   [phone] )  [facility] [x]   [phone] )  [place], [place].D [phone] 04/19/2018 11:09 AM

## 2018-04-19 NOTE — Progress Notes (Signed)
Subjective: Ronald Hurst, a 42 year old male admitted with sickle cell crisis, left chest pain and pulmonary infarct. Patient says that left chest pain has improved overnight. Current pain intensity is 3-4/10. Patient has not ambulated in halls. He denies dyspnea. He has remained afebrile and oxygen saturation is 99% on room air.   Objective:  Vital signs in last 24 hours:  Vitals:   04/18/18 2022 04/18/18 2023 04/19/18 0535 04/19/18 0937  BP: (!) 138/102 (!) 136/97 (!) 148/103 (!) 133/96  Pulse: 72 76 97 85  Resp: 16  16 16   Temp: 98.8 F (37.1 C)  99.5 F (37.5 C) 98.7 F (37.1 C)  TempSrc: Oral  Oral Oral  SpO2: 99% 99% 95% 94%  Weight:      Height:        Intake/Output from previous day:   Intake/Output Summary (Last 24 hours) at 04/19/2018 1146 Last data filed at 04/19/2018 6720 Gross per 24 hour  Intake 2027.04 ml  Output -  Net 2027.04 ml    Physical Exam: General: Alert, awake, oriented x3, in no acute distress.  HEENT: [place]/AT PEERL, EOMI Neck: Trachea midline,  no masses, no thyromegal,y no JVD, no carotid bruit OROPHARYNX:  Moist, No exudate/ erythema/lesions.  Heart: Regular rate and rhythm, without murmurs, rubs, gallops, PMI non-displaced, no heaves or thrills on palpation.  Lungs: Clear to auscultation, no wheezing or rhonchi noted. No increased vocal fremitus resonant to percussion  Abdomen: Soft, nontender, nondistended, positive bowel sounds, no masses no hepatosplenomegaly noted..  Neuro: No focal neurological deficits noted cranial nerves II through XII grossly intact. DTRs 2+ bilaterally upper and lower extremities. Strength 5 out of 5 in bilateral upper and lower extremities. Musculoskeletal: No warm swelling or erythema around joints, no spinal tenderness noted. Psychiatric: Patient alert and oriented x3, good insight and cognition, good recent to remote recall. Lymph node survey: No cervical axillary or inguinal lymphadenopathy noted.  Lab  Results:  Basic Metabolic Panel:    Component Value Date/Time   NA 140 04/19/2018 0345   K 3.7 04/19/2018 0345   CL 108 04/19/2018 0345   CO2 26 04/19/2018 0345   BUN 9 04/19/2018 0345   CREATININE 1.06 04/19/2018 0345   GLUCOSE 106 (H) 04/19/2018 0345   CALCIUM 8.3 (L) 04/19/2018 0345   CBC:    Component Value Date/Time   WBC 11.1 (H) 04/19/2018 0345   HGB 11.0 (L) 04/19/2018 0345   HCT 30.7 (L) 04/19/2018 0345   PLT 329 04/19/2018 0345   MCV 76.2 (L) 04/19/2018 0345   NEUTROABS 6.0 04/19/2018 0345   LYMPHSABS 3.2 04/19/2018 0345   MONOABS 1.7 (H) 04/19/2018 0345   EOSABS 0.2 04/19/2018 0345   BASOSABS 0.0 04/19/2018 0345    No results found for this or any previous visit (from the past 240 hour(s)).  Studies/Results: Dg Chest 2 View  Result Date: 04/18/2018 CLINICAL DATA:  Chest pain. EXAM: CHEST - 2 VIEW COMPARISON:  None. FINDINGS: Low lung volumes. Ill-defined streaky bibasilar opacities with more focal opacity in the lingula. The heart is normal in size. Normal mediastinal contours. No pulmonary edema, pleural effusion or pneumothorax. No acute osseous abnormalities. IMPRESSION: Low lung volumes. Streaky bibasilar opacities likely atelectasis. Slightly more focal opacity in the lingula may be pneumonia or acute chest syndrome in the setting of sickle cell anemia. Electronically Signed   By: Jeb Levering M.D.   On: 04/18/2018 02:08   Ct Angio Chest Pe W And/or Wo Contrast  Result Date:  04/18/2018 CLINICAL DATA:  Chest pain for 2 days. Recent car travel from [place]. History of sickle cell. PE suspected, intermediate prob, positive D-dimer EXAM: CT ANGIOGRAPHY CHEST WITH CONTRAST TECHNIQUE: Multidetector CT imaging of the chest was performed using the standard protocol during bolus administration of intravenous contrast. Multiplanar CT image reconstructions and MIPs were obtained to evaluate the vascular anatomy. CONTRAST:  61mL ISOVUE-370 IOPAMIDOL (ISOVUE-370)  INJECTION 76% COMPARISON:  Chest radiograph earlier this day FINDINGS: Cardiovascular: No filling defects in the pulmonary arteries to the segmental level to suggest pulmonary embolus. Subsegmental branches cannot be assessed due to contrast bolus timing. Thoracic aorta is normal in caliber without dissection. Left vertebral artery arises directly from the aorta, normal variant. Normal heart size. No pericardial effusion. Mediastinum/Nodes: Small mediastinal and bilateral hilar nodes not enlarged by size criteria, likely reactive. The esophagus is decompressed. No thyroid nodule. Lungs/Pleura: Peripheral opacity abutting the pleural in the lingula suggestive of pulmonary infarct. Left lower lobe opacity dependently may be infarct or atelectasis. Scattered subsegmental atelectasis in the right lower and middle lobe. Trace left pleural thickening. Upper Abdomen: Small spleen in keeping with sickle cell disease. Low-density lesion in the left kidney likely cysts but incompletely included in the field of view. Vague 18 mm low-density lesion in the right dome of the liver which is described on abdominal CT 05/18/2012, images not available. Musculoskeletal: There are no acute or suspicious osseous abnormalities. Review of the MIP images confirms the above findings. IMPRESSION: 1. No pulmonary embolus to the segmental level. 2. Peripheral lingular opacity suggesting pulmonary infarct. Dependent left lower lobe opacity may be atelectasis or additional infarct. Scattered atelectasis throughout both lungs. 3. Vague low-density lesion in the right lobe of the liver, incompletely characterized. This was described on abdominal CT of 05/18/2012, however images not available for direct comparison. Recommend nonemergent MRI characterization if not performed in the interim. Electronically Signed   By: Jeb Levering M.D.   On: 04/18/2018 04:16    Medications: Scheduled Meds: . [START ON 04/20/2018] azithromycin  500 mg Oral  Daily  . enoxaparin (LOVENOX) injection  40 mg Subcutaneous Q24H  . folic acid  1 mg Oral Daily  . ketorolac  15 mg Intravenous Q6H  . nicotine  21 mg Transdermal Daily  . pneumococcal 23 valent vaccine  0.5 mL Intramuscular Tomorrow-1000   Continuous Infusions: . sodium chloride 75 mL/hr at 04/19/18 0330  . sodium chloride Stopped (04/18/18 0558)  . cefTRIAXone (ROCEPHIN)  IV Stopped (04/19/18 0536)   PRN Meds:.sodium chloride, morphine injection, oxyCODONE, polyethylene glycol   Antibiotics:  Assessment/Plan: Active Problems:   Pulmonary infarct (HCC)   Acute chest syndrome (HCC)   Tobacco dependence   Sickle cell crisis (HCC)   Leukocytosis  Pneumonia vs. Acute Chest Syndrome:  Chest pain has resolved with IV antibiotics. WBC count has decreased to 11.1. Patient transitioned to oral antibiotics.   Hb Sickle Cell Disease with crisis:  Continue IVF D5 .45% Saline @ 50 mls/hour, continue weight based Dilaudid PCA, IV Toradol 30 mg Q 6 H, Monitor vitals very closely  Sickle Cell Anemia:  Folic acid 1 mg  Leukocytosis WBC decreased to 11.1, will continue to monitor closely  Tobacco dependence Continue Nicotine patch    Code Status: Full Code Family Communication: N/A Disposition Plan: Not yet ready for discharge. Probable discharge on 04/20/2018  [name]  APRN, MSN, FNP-C Patient [facility] [address] [place], [place] [address] [phone]  If  7PM-7AM, please contact night-coverage.  04/19/2018, 11:46 AM  LOS: 1 day

## 2018-04-19 NOTE — Progress Notes (Signed)
Ronald Hurst, a 42 year old male with a history of sickle cell anemia has a consistently elevated blood pressure. Will start a trial of amlodipine 5 mg daily. Will continue to monitor closely.   [name]  APRN, MSN, FNP-C Patient [facility] [address] [place], [place] [address] [phone]

## 2018-04-20 ENCOUNTER — Encounter (HOSPITAL_COMMUNITY): Payer: Self-pay | Admitting: Family Medicine

## 2018-04-20 DIAGNOSIS — I10 Essential (primary) hypertension: Secondary | ICD-10-CM

## 2018-04-20 LAB — CBC
HCT: 32 % — ABNORMAL LOW (ref 39.0–52.0)
HEMOGLOBIN: 11.8 g/dL — AB (ref 13.0–17.0)
MCH: 27.3 pg (ref 26.0–34.0)
MCHC: 36.9 g/dL — AB (ref 30.0–36.0)
MCV: 73.9 fL — ABNORMAL LOW (ref 78.0–100.0)
Platelets: 384 10*3/uL (ref 150–400)
RBC: 4.33 MIL/uL (ref 4.22–5.81)
RDW: 14.5 % (ref 11.5–15.5)
WBC: 8.9 10*3/uL (ref 4.0–10.5)

## 2018-04-20 MED ORDER — FOLIC ACID 1 MG PO TABS: 1.0000 mg | ORAL_TABLET | Freq: Every day | ORAL | 0 refills | Status: DC

## 2018-04-20 MED ORDER — IBUPROFEN 600 MG PO TABS: 600.0000 mg | ORAL_TABLET | Freq: Three times a day (TID) | ORAL | 0 refills | Status: DC | PRN

## 2018-04-20 MED ORDER — OXYCODONE HCL 5 MG PO TABS: 5.0000 mg | ORAL_TABLET | Freq: Four times a day (QID) | ORAL | 0 refills | Status: DC | PRN

## 2018-04-20 MED ORDER — AMLODIPINE BESYLATE 5 MG PO TABS: 5.0000 mg | ORAL_TABLET | Freq: Every day | ORAL | 0 refills | Status: DC

## 2018-04-20 MED ORDER — NICOTINE 21 MG/24HR TD PT24: 21.0000 mg | MEDICATED_PATCH | Freq: Every day | TRANSDERMAL | 0 refills | Status: DC

## 2018-04-20 MED ORDER — AZITHROMYCIN 500 MG PO TABS: ORAL_TABLET | ORAL | 0 refills | Status: AC

## 2018-04-20 NOTE — Discharge Instructions (Addendum)
Amlodipine 5 mg daily for hypertension.  Continue medication, monitor blood pressure at home. Continue DASH diet. Reminder to go to the ER if any CP, SOB, nausea, dizziness, severe HA, changes vision/speech, left arm numbness and tingling and jaw pain.  Appointment scheduled to establish care with primary care provider.   Left message with Marguirite at [place]. Please contact at [phone]  Hypertension Hypertension is another name for high blood pressure. High blood pressure forces your heart to work harder to pump blood. This can cause problems over time. There are two numbers in a blood pressure reading. There is a top number (systolic) over a bottom number (diastolic). It is best to have a blood pressure below 120/80. Healthy choices can help lower your blood pressure. You may need medicine to help lower your blood pressure if:  Your blood pressure cannot be lowered with healthy choices.  Your blood pressure is higher than 130/80.  Follow these instructions at home: Eating and drinking  If directed, follow the DASH eating plan. This diet includes: ? Filling half of your plate at each meal with fruits and vegetables. ? Filling one quarter of your plate at each meal with whole grains. Whole grains include whole wheat pasta, brown rice, and whole grain bread. ? Eating or drinking low-fat dairy products, such as skim milk or low-fat yogurt. ? Filling one quarter of your plate at each meal with low-fat (lean) proteins. Low-fat proteins include fish, skinless chicken, eggs, beans, and tofu. ? Avoiding fatty meat, cured and processed meat, or chicken with skin. ? Avoiding premade or processed food.  Eat less than 1,500 mg of salt (sodium) a day.  Limit alcohol use to no more than 1 drink a day for nonpregnant women and 2 drinks a day for men. One drink equals 12 oz of beer, 5 oz of wine, or 1 oz of hard liquor. Lifestyle  Work with your doctor to stay at a healthy  weight or to lose weight. Ask your doctor what the best weight is for you.  Get at least 30 minutes of exercise that causes your heart to beat faster (aerobic exercise) most days of the week. This may include walking, swimming, or biking.  Get at least 30 minutes of exercise that strengthens your muscles (resistance exercise) at least 3 days a week. This may include lifting weights or pilates.  Do not use any products that contain nicotine or tobacco. This includes cigarettes and e-cigarettes. If you need help quitting, ask your doctor.  Check your blood pressure at home as told by your doctor.  Keep all follow-up visits as told by your doctor. This is important. Medicines  Take over-the-counter and prescription medicines only as told by your doctor. Follow directions carefully.  Do not skip doses of blood pressure medicine. The medicine does not work as well if you skip doses. Skipping doses also puts you at risk for problems.  Ask your doctor about side effects or reactions to medicines that you should watch for. Contact a doctor if:  You think you are having a reaction to the medicine you are taking.  You have headaches that keep coming back (recurring).  You feel dizzy.  You have swelling in your ankles.  You have trouble with your vision. Get help right away if:  You get a very bad headache.  You start to feel confused.  You feel weak or numb.  You feel faint.  You get very bad pain in your: ?  Chest. ? Belly (abdomen).  You throw up (vomit) more than once.  You have trouble breathing. Summary  Hypertension is another name for high blood pressure.  Making healthy choices can help lower blood pressure. If your blood pressure cannot be controlled with healthy choices, you may need to take medicine. This information is not intended to replace advice given to you by your health care provider. Make sure you discuss any questions you have with your health care  provider. Document Released: 02/24/2008 Document Revised: 08/05/2016 Document Reviewed: 08/05/2016 Elsevier Interactive Patient Education  2018 [employer].  Sickle Cell Anemia, Adult Sickle cell anemia is a condition where your red blood cells are shaped like sickles. Red blood cells carry oxygen through the body. Sickle-shaped red blood cells do not live as long as normal red blood cells. They also clump together and block blood from flowing through the blood vessels. These things prevent the body from getting enough oxygen. Sickle cell anemia causes organ damage and pain. It also increases the risk of infection. Follow these instructions at home:  Drink enough fluid to keep your pee (urine) clear or pale yellow. Drink more in hot weather and during exercise.  Do not smoke. Smoking lowers oxygen levels in the blood.  Only take over-the-counter or prescription medicines as told by your doctor.  Take antibiotic medicines as told by your doctor. Make sure you finish them even if you start to feel better.  Take supplements as told by your doctor.  Consider wearing a medical alert bracelet. This tells anyone caring for you in an emergency of your condition.  When traveling, keep your medical information, doctors' names, and the medicines you take with you at all times.  If you have a fever, do not take fever medicines right away. This could cover up a problem. Tell your doctor.  Keep all follow-up visits with your doctor. Sickle cell anemia requires regular medical care. Contact a doctor if: You have a fever. Get help right away if:  You feel dizzy or faint.  You have new belly (abdominal) pain, especially on the left side near the stomach area.  You have a lasting, often uncomfortable and painful erection of the penis (priapism). If it is not treated right away, you will become unable to have sex (impotence).  You have numbness in your arms or legs or you have a hard time moving  them.  You have a hard time talking.  You have a fever or lasting symptoms for more than 2-3 days.  You have a fever and your symptoms suddenly get worse.  You have signs or symptoms of infection. These include: ? Chills. ? Being more tired than normal (lethargy). ? Irritability. ? Poor eating. ? Throwing up (vomiting).  You have pain that is not helped with medicine.  You have shortness of breath.  You have pain in your chest.  You are coughing up pus-like or bloody mucus.  You have a stiff neck.  Your feet or hands swell or have pain.  Your belly looks bloated.  Your joints hurt. This information is not intended to replace advice given to you by your health care provider. Make sure you discuss any questions you have with your health care provider. Document Released: 06/28/2013 Document Revised: 02/13/2016 Document Reviewed: 04/19/2013 Elsevier Interactive Patient Education  2017 [employer].

## 2018-04-20 NOTE — Discharge Summary (Signed)
Physician Discharge Summary  Ronald Hurst IWO:[id number] DOB: 1976-04-09 DOA: 04/18/2018  PCP: Patient, No Pcp Per  Admit date: 04/18/2018  Discharge date: 04/20/2018  Discharge Diagnoses:  Active Problems:   Pulmonary infarct (HCC)   Acute chest syndrome (HCC)   Tobacco dependence   Sickle cell crisis ([place])   Leukocytosis   Discharge Condition: Stable  Disposition:  Pt is discharged home in good condition and is to follow up with [facility] in one week to establish care and have labs evaluated.  Ronald Hurst is instructed to increase activity slowly and balance with rest for the next few days, and use prescribed medication to complete treatment of pain  Diet: Regular Wt Readings from Last 3 Encounters:  04/18/18 165 lb 2 oz (74.9 kg)  09/08/17 160 lb (72.6 kg)  01/28/15 155 lb (70.3 kg)    History of present illness:  Ronald Hurst, a 42 year old male with a medical history significant for sickle cell anemia and tobacco dependence presenting complaining of left chest and left upper extremity pain for 2 days.  Patient returned from vacation in [place] and had a 12-hour car ride both ways.  Pain intensity is 5-6/10 characterized as intermittent and aching.  Patient typically takes ibuprofen for sickle cell pain and is opiate nave.  Patient has sickle cell crises infrequently and does not have a PCP.  Patient denies shortness of breath, heart palpitations, fatigue, dysuria, nausea, vomiting, or diarrhea.  ER course:  Patient presented to [facility] with left chest pain.  Patient was treated with IV morphine and IV fluids, pain improved moderately.  Patient underwent CT angiogram, which showed no pulmonary embolism at the segmental level.  Also, a peripheral lingular opacities suggesting pulmonary infarct and dependent left lower opacity.  Patient administered Rocephin and azithromycin.  Blood cultures were obtained.  Oxygen saturation was  90-91%, on room air.  Temperature 100.3, mild tachycardia, WBC count 17,000.  Hemoglobin 12.9, with an unknown baseline.  Patient is opiate nave.    Hospital Course:  Ronald Hurst was admitted to inpatient services for acute chest syndrome versus left lower lobe pneumonia.  Patient was treated with IV antibiotics and transition to oral antibiotics prior to discharge.  Left upper chest pain resolved.  WBC count decreased to normal range.  Patient will follow-up in primary care in 1 week to repeat CBC and CMP.  Patient was admitted for sickle cell pain crisis and managed appropriately with IVF, IV morphine, oxycodone p.o., and IV Toradol, as well as other adjunct therapies per sickle cell pain management protocols.  Hypertension: Patient's blood pressure remain elevated above baseline throughout hospital stay.  Amlodipine 5 mg daily was added to medication regimen.    Tobacco dependence: Nicotine patches affected during admission, will continue outpatient.  Patient was discharged home today in a hemodynamically stable condition.   Discharge Exam: Vitals:   04/20/18 0507 04/20/18 1016  BP: (!) 125/94 (!) 157/105  Pulse: 72 72  Resp: 18 16  Temp: 98.7 F (37.1 C) 98.8 F (37.1 C)  SpO2: 94% 100%   Vitals:   04/19/18 2025 04/20/18 0221 04/20/18 0507 04/20/18 1016  BP: (!) 158/97 (!) 119/94 (!) 125/94 (!) 157/105  Pulse: 92 74 72 72  Resp: 18 18 18 16   Temp: 98.8 F (37.1 C) 98 F (36.7 C) 98.7 F (37.1 C) 98.8 F (37.1 C)  TempSrc: Oral Oral Oral Oral  SpO2: 98% 94% 94% 100%  Weight:      Height:  General appearance : Awake, alert, not in any distress. Speech Clear. Not toxic looking HEENT: Atraumatic and Normocephalic, pupils equally reactive to light and accomodation Neck: Supple, no JVD. No cervical lymphadenopathy.  Chest: Good air entry bilaterally, no added sounds  CVS: S1 S2 regular, no murmurs.  Abdomen: Bowel sounds present, Non tender and not  distended with no gaurding, rigidity or rebound. Extremities: B/L Lower Ext shows no edema, both legs are warm to touch Neurology: Awake alert, and oriented X 3, CN II-XII intact, Non focal Skin: No Rash  Discharge Instructions  Discharge Instructions    Discharge patient   Complete by:  As directed    Discharge disposition:  01-Home or Self Care   Discharge patient date:  04/20/2018     Allergies as of 04/20/2018   No Known Allergies     Medication List    STOP taking these medications   amoxicillin-clavulanate 875-125 MG tablet Commonly known as:  AUGMENTIN   cephALEXin 500 MG capsule Commonly known as:  KEFLEX     TAKE these medications   amLODipine 5 MG tablet Commonly known as:  NORVASC Take 1 tablet (5 mg total) by mouth daily. Start taking on:  04/21/2018   azithromycin 500 MG tablet Commonly known as:  ZITHROMAX Take 500 mg daily Start taking on:  02/27/6294   folic acid 1 MG tablet Commonly known as:  FOLVITE Take 1 tablet (1 mg total) by mouth daily. Start taking on:  04/21/2018   ibuprofen 600 MG tablet Commonly known as:  ADVIL,MOTRIN Take 1 tablet (600 mg total) by mouth every 8 (eight) hours as needed for moderate pain.   nicotine 21 mg/24hr patch Commonly known as:  NICODERM CQ - dosed in mg/24 hours Place 1 patch (21 mg total) onto the skin daily. Start taking on:  04/21/2018   oxyCODONE 5 MG immediate release tablet Commonly known as:  Oxy IR/ROXICODONE Take 1 tablet (5 mg total) by mouth every 6 (six) hours as needed for moderate pain or severe pain.       The results of significant diagnostics from this hospitalization (including imaging, microbiology, ancillary and laboratory) are listed below for reference.    Significant Diagnostic Studies: Dg Chest 2 View  Result Date: 04/18/2018 CLINICAL DATA:  Chest pain. EXAM: CHEST - 2 VIEW COMPARISON:  None. FINDINGS: Low lung volumes. Ill-defined streaky bibasilar opacities with more focal opacity in  the lingula. The heart is normal in size. Normal mediastinal contours. No pulmonary edema, pleural effusion or pneumothorax. No acute osseous abnormalities. IMPRESSION: Low lung volumes. Streaky bibasilar opacities likely atelectasis. Slightly more focal opacity in the lingula may be pneumonia or acute chest syndrome in the setting of sickle cell anemia. Electronically Signed   By: Jeb Levering M.D.   On: 04/18/2018 02:08   Ct Angio Chest Pe W And/or Wo Contrast  Result Date: 04/18/2018 CLINICAL DATA:  Chest pain for 2 days. Recent car travel from [place]. History of sickle cell. PE suspected, intermediate prob, positive D-dimer EXAM: CT ANGIOGRAPHY CHEST WITH CONTRAST TECHNIQUE: Multidetector CT imaging of the chest was performed using the standard protocol during bolus administration of intravenous contrast. Multiplanar CT image reconstructions and MIPs were obtained to evaluate the vascular anatomy. CONTRAST:  89mL ISOVUE-370 IOPAMIDOL (ISOVUE-370) INJECTION 76% COMPARISON:  Chest radiograph earlier this day FINDINGS: Cardiovascular: No filling defects in the pulmonary arteries to the segmental level to suggest pulmonary embolus. Subsegmental branches cannot be assessed due to contrast bolus timing. Thoracic aorta is  normal in caliber without dissection. Left vertebral artery arises directly from the aorta, normal variant. Normal heart size. No pericardial effusion. Mediastinum/Nodes: Small mediastinal and bilateral hilar nodes not enlarged by size criteria, likely reactive. The esophagus is decompressed. No thyroid nodule. Lungs/Pleura: Peripheral opacity abutting the pleural in the lingula suggestive of pulmonary infarct. Left lower lobe opacity dependently may be infarct or atelectasis. Scattered subsegmental atelectasis in the right lower and middle lobe. Trace left pleural thickening. Upper Abdomen: Small spleen in keeping with sickle cell disease. Low-density lesion in the left kidney likely cysts  but incompletely included in the field of view. Vague 18 mm low-density lesion in the right dome of the liver which is described on abdominal CT 05/18/2012, images not available. Musculoskeletal: There are no acute or suspicious osseous abnormalities. Review of the MIP images confirms the above findings. IMPRESSION: 1. No pulmonary embolus to the segmental level. 2. Peripheral lingular opacity suggesting pulmonary infarct. Dependent left lower lobe opacity may be atelectasis or additional infarct. Scattered atelectasis throughout both lungs. 3. Vague low-density lesion in the right lobe of the liver, incompletely characterized. This was described on abdominal CT of 05/18/2012, however images not available for direct comparison. Recommend nonemergent MRI characterization if not performed in the interim. Electronically Signed   By: Jeb Levering M.D.   On: 04/18/2018 04:16    Microbiology: Recent Results (from the past 240 hour(s))  Blood culture (routine x 2)     Status: None (Preliminary result)   Collection Time: 04/18/18  4:40 AM  Result Value Ref Range Status   Specimen Description   Final    BLOOD RIGHT ARM Performed at [facility], [address] [place], [place] [address]    Special Requests   Final    BOTTLES DRAWN AEROBIC AND ANAEROBIC Blood Culture adequate volume Performed at [facility], [address], [place], [place] [address]    Culture   Final    NO GROWTH 1 DAY Performed at [facility], [place] [address] [place], [place] [address]    Report Status PENDING  Incomplete  Blood culture (routine x 2)     Status: None (Preliminary result)   Collection Time: 04/18/18  4:40 AM  Result Value Ref Range Status   Specimen Description   Final    BLOOD LEFT HAND Performed at [facility], [address] [place], [place] [address]    Special Requests   Final    BOTTLES DRAWN AEROBIC AND ANAEROBIC Blood Culture adequate volume Performed at  [facility], [address] [place], [place] [address]    Culture   Final    NO GROWTH 1 DAY Performed at [facility], [place] [address] [place], [place] [address]    Report Status PENDING  Incomplete     Labs: Basic Metabolic Panel: Recent Labs  Lab 04/18/18 0208 04/18/18 1225 04/19/18 0345  NA 141  --  140  K 3.4*  --  3.7  CL 108  --  108  CO2 24  --  26  GLUCOSE 111*  --  106*  BUN 8  --  9  CREATININE 1.19 1.13 1.06  CALCIUM 8.8*  --  8.3*   Liver Function Tests: Recent Labs  Lab 04/19/18 0345  AST 17  ALT 14  ALKPHOS 49  BILITOT 1.2  PROT 6.3*  ALBUMIN 3.3*   No results for input(s): LIPASE, AMYLASE in  the last 168 hours. No results for input(s): AMMONIA in the last 168 hours. CBC: Recent Labs  Lab 04/18/18 0208 04/18/18 1225 04/19/18 0345 04/20/18 0926  WBC 17.0* 14.2* 11.1* 8.9  NEUTROABS 13.4*  --  6.0  --   HGB 12.9* 12.8* 11.0* 11.8*  HCT 34.6* 35.5* 30.7* 32.0*  MCV 73.3* 76.5* 76.2* 73.9*  PLT 407* 361 329 384   Cardiac Enzymes: Recent Labs  Lab 04/18/18 0208  TROPONINI <0.03   BNP: Invalid input(s): POCBNP CBG: No results for input(s): GLUCAP in the last 168 hours.  Time coordinating discharge: 50 minutes  Signed:  [name]  APRN, MSN, FNP-C [facility] [address] [place], [place] [address] [phone]  [facility] 04/20/2018, 11:48 AM

## 2018-04-21 LAB — HEMOGLOBINOPATHY EVALUATION
HGB A: 0 % — AB (ref 96.4–98.8)
HGB F QUANT: 3.7 % — AB (ref 0.0–2.0)
HGB VARIANT: 0 %
Hgb A2 Quant: 4 % — ABNORMAL HIGH (ref 1.8–3.2)
Hgb C: 41.2 % — ABNORMAL HIGH
Hgb S Quant: 51.1 % — ABNORMAL HIGH

## 2018-04-23 LAB — CULTURE, BLOOD (ROUTINE X 2)
Culture: NO GROWTH
Culture: NO GROWTH
SPECIAL REQUESTS: ADEQUATE
SPECIAL REQUESTS: ADEQUATE

## 2018-04-28 ENCOUNTER — Encounter: Payer: Self-pay | Admitting: Family Medicine

## 2018-04-28 ENCOUNTER — Ambulatory Visit (INDEPENDENT_AMBULATORY_CARE_PROVIDER_SITE_OTHER): Payer: Self-pay | Admitting: Family Medicine

## 2018-04-28 VITALS — BP 140/95 | HR 77 | Temp 98.0°F | Resp 16 | Ht 69.0 in | Wt 160.0 lb

## 2018-04-28 DIAGNOSIS — D571 Sickle-cell disease without crisis: Secondary | ICD-10-CM

## 2018-04-28 DIAGNOSIS — E559 Vitamin D deficiency, unspecified: Secondary | ICD-10-CM

## 2018-04-28 DIAGNOSIS — I10 Essential (primary) hypertension: Secondary | ICD-10-CM

## 2018-04-28 DIAGNOSIS — D5700 Hb-SS disease with crisis, unspecified: Secondary | ICD-10-CM

## 2018-04-28 DIAGNOSIS — F17200 Nicotine dependence, unspecified, uncomplicated: Secondary | ICD-10-CM

## 2018-04-28 MED ORDER — IBUPROFEN 600 MG PO TABS: 600.0000 mg | ORAL_TABLET | Freq: Three times a day (TID) | ORAL | 2 refills | Status: DC | PRN

## 2018-04-28 MED ORDER — FOLIC ACID 1 MG PO TABS: 1.0000 mg | ORAL_TABLET | Freq: Every day | ORAL | 2 refills | Status: DC

## 2018-04-28 MED ORDER — AMLODIPINE BESYLATE 5 MG PO TABS: 5.0000 mg | ORAL_TABLET | Freq: Every day | ORAL | 2 refills | Status: DC

## 2018-04-28 MED ORDER — OXYCODONE HCL 5 MG PO TABS: 5.0000 mg | ORAL_TABLET | Freq: Four times a day (QID) | ORAL | 0 refills | Status: DC | PRN

## 2018-04-28 MED ORDER — FOLIC ACID 1 MG PO TABS: 1.0000 mg | ORAL_TABLET | Freq: Every day | ORAL | 0 refills | Status: DC

## 2018-04-28 NOTE — Progress Notes (Signed)
Subjective   Ronald Hurst 42 y.o. male  [id number]  [id number]  04-15-1976 41 y.o.     Chief Complaint  Patient presents with  . Sickle Cell Anemia  . Hypertension  . Medication Refill    folic acid     Patient presents to establish care. Patient with hx of Sickle Cell [place]. Last crisis was 04/18/2018. Patient states that this is the first one in the past 12 months. Was treated for possible ACS.    Past Medical History:  Diagnosis Date  . Essential hypertension   . Sickle cell anemia (HCC)     Social History   Socioeconomic History  . Marital status: Single    Spouse name: Not on file  . Number of children: Not on file  . Years of education: Not on file  . Highest education level: Not on file  Occupational History  . Not on file  Social Needs  . Financial resource strain: Not on file  . Food insecurity:    Worry: Not on file    Inability: Not on file  . Transportation needs:    Medical: Not on file    Non-medical: Not on file  Tobacco Use  . Smoking status: Current Every Day Smoker    Packs/day: 0.50    Types: Cigarettes  . Smokeless tobacco: Never Used  Substance and Sexual Activity  . Alcohol use: Yes  . Drug use: Yes    Types: Marijuana  . Sexual activity: Not on file  Lifestyle  . Physical activity:    Days per week: Not on file    Minutes per session: Not on file  . Stress: Not on file  Relationships  . Social connections:    Talks on phone: Not on file    Gets together: Not on file    Attends religious service: Not on file    Active member of club or organization: Not on file    Attends meetings of clubs or organizations: Not on file    Relationship status: Not on file  . Intimate partner violence:    Fear of current or ex partner: Not on file    Emotionally abused: Not on file    Physically abused: Not on file    Forced sexual activity: Not on file  Other Topics Concern  . Not on file  Social History Narrative  . Not on  file      Review of Systems  Constitutional: Negative.   HENT: Negative.   Eyes: Negative.   Respiratory: Negative.   Cardiovascular: Negative.   Gastrointestinal: Negative.   Genitourinary: Negative.   Musculoskeletal: Negative.   Skin: Negative.   Neurological: Negative.   Psychiatric/Behavioral: Negative.     Objective   Physical Exam  Constitutional: He is oriented to person, place, and time. He appears well-developed and well-nourished.  HENT:  Head: Normocephalic and atraumatic.  Eyes: Pupils are equal, round, and reactive to light. Conjunctivae and EOM are normal.  Neck: Normal range of motion. Neck supple. No tracheal deviation present. No thyromegaly present.  Cardiovascular: Normal rate, regular rhythm, normal heart sounds and intact distal pulses. Exam reveals no friction rub.  No murmur heard. Pulmonary/Chest: Effort normal and breath sounds normal. No stridor. No respiratory distress. He has no wheezes.  Abdominal: Soft. Bowel sounds are normal. He exhibits no distension and no mass. There is no tenderness.  Musculoskeletal: Normal range of motion.  Lymphadenopathy:    He has no cervical adenopathy.  Neurological:  He is alert and oriented to person, place, and time.  Skin: Skin is warm and dry.  Psychiatric: He has a normal mood and affect. His behavior is normal. Judgment and thought content normal.  Nursing note and vitals reviewed.   BP (!) 140/95 (BP Location: Left Arm, Patient Position: Sitting, Cuff Size: Normal)   Pulse 77   Temp 98 F (36.7 C) (Oral)   Resp 16   Ht 5\' 9"  (1.753 m)   Wt 160 lb (72.6 kg)   SpO2 100%   BMI 23.63 kg/m   Assessment   Encounter Diagnoses  Name Primary?  . Sickle cell crisis ([place])   . 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 Essential hypertension      Plan  1. Sickle cell crisis [facility]) The current medical regimen is effective;  continue present plan and medications.  2. Hb-SS disease without crisis ([place]) -  Comprehensive metabolic panel - VITAMIN D 25 Hydroxy (Vit-D Deficiency, Fractures) - Ambulatory referral to Ophthalmology - CBC With Differential - oxyCODONE (OXY IR/ROXICODONE) 5 MG immediate release tablet; Take 1 tablet (5 mg total) by mouth every 6 (six) hours as needed for up to 15 days for moderate pain or severe pain.  Dispense: 60 tablet; Refill: 0 - ibuprofen (ADVIL,MOTRIN) 600 MG tablet; Take 1 tablet (600 mg total) by mouth every 8 (eight) hours as needed for moderate pain.  Dispense: 30 tablet; Refill: 2 - folic acid (FOLVITE) 1 MG tablet; Take 1 tablet (1 mg total) by mouth daily.  Dispense: 30 tablet; Refill: 2 - [id number]+OXYCODONE+CRT-UNBUND  3. Essential hypertension Refilled medications.  - Comprehensive metabolic panel - CBC With Differential - amLODipine (NORVASC) 5 MG tablet; Take 1 tablet (5 mg total) by mouth daily.  Dispense: 30 tablet; Refill: 2   Return to care as scheduled and prn. Patient verbalized understanding and agreed with plan of care.   1. Sickle cell disease -  We discussed the need for good hydration, monitoring of hydration status, avoidance of heat, cold, stress, and infection triggers. We discussed the risks and benefits of Hydrea, including bone marrow suppression, the possibility of GI upset, skin ulcers, hair thinning, and teratogenicity. The patient was reminded of the need to seek medical attention of any symptoms of bleeding, anemia, or infection.   Continue folic acid 1 mg daily to prevent aplastic bone marrow crises.   2. Pulmonary evaluation - Patient denies severe recurrent wheezes, shortness of breath with exercise, or persistent cough. If these symptoms develop, pulmonary function tests with spirometry will be ordered, and if abnormal, plan on referral to Pulmonology for further evaluation.  3. Cardiac - Routine screening for pulmonary hypertension is not recommended.  4. Eye - High risk of proliferative retinopathy. Annual eye exam with  retinal exam recommended to patient.  5. Immunization status - She declines vaccines today. Yearly influenza vaccination is recommended, as well as being up to date with Meningococcal and Pneumococcal vaccines.   6. Acute and chronic painful episodes -. We discussed that pt is to receive Schedule II prescriptions only from [place]. Pt is also aware that the prescription history is available to [place] online through the [facility] CSRS. Controlled substance agreement signed (Date). We reminded (Pt) that all patients receiving Schedule II narcotics must be seen for follow within one month of prescription being requested. We reviewed the terms of our pain agreement, including the need to keep medicines in a safe locked location away from children or pets, and the need  to report excess sedation or constipation, measures to avoid constipation, and policies related to early refills and stolen prescriptions. According to the [employer] program, we have reviewed details related to analgesia, adverse effects, aberrant behaviors.  7. Iron overload from chronic transfusion.  Not applicable at the present time.   8. Vitamin D deficiency - Drisdol 50,000 units weekly. Patient encouraged to take as prescribed.  The above recommendations are taken from the NIH Evidence-Based Management of Sickle Cell Disease: Expert Panel Report, 20149.   [name], FNP-BC [facility] [address] [place], [place] [address] [phone]  This note has been created with Dragon speech recognition software and smart phrase technology. Any transcriptional errors are unintentional.

## 2018-04-28 NOTE — Patient Instructions (Signed)
Sickle Cell Anemia, Adult °Sickle cell anemia is a condition where your red blood cells are shaped like sickles. Red blood cells carry oxygen through the body. Sickle-shaped red blood cells do not live as long as normal red blood cells. They also clump together and block blood from flowing through the blood vessels. These things prevent the body from getting enough oxygen. Sickle cell anemia causes organ damage and pain. It also increases the risk of infection. °Follow these instructions at home: °· Drink enough fluid to keep your pee (urine) clear or pale yellow. Drink more in hot weather and during exercise. °· Do not smoke. Smoking lowers oxygen levels in the blood. °· Only take over-the-counter or prescription medicines as told by your doctor. °· Take antibiotic medicines as told by your doctor. Make sure you finish them even if you start to feel better. °· Take supplements as told by your doctor. °· Consider wearing a medical alert bracelet. This tells anyone caring for you in an emergency of your condition. °· When traveling, keep your medical information, doctors' names, and the medicines you take with you at all times. °· If you have a fever, do not take fever medicines right away. This could cover up a problem. Tell your doctor. °· Keep all follow-up visits with your doctor. Sickle cell anemia requires regular medical care. °Contact a doctor if: °You have a fever. °Get help right away if: °· You feel dizzy or faint. °· You have new belly (abdominal) pain, especially on the left side near the stomach area. °· You have a lasting, often uncomfortable and painful erection of the penis (priapism). If it is not treated right away, you will become unable to have sex (impotence). °· You have numbness in your arms or legs or you have a hard time moving them. °· You have a hard time talking. °· You have a fever or lasting symptoms for more than 2-3 days. °· You have a fever and your symptoms suddenly get  worse. °· You have signs or symptoms of infection. These include: °? Chills. °? Being more tired than normal (lethargy). °? Irritability. °? Poor eating. °? Throwing up (vomiting). °· You have pain that is not helped with medicine. °· You have shortness of breath. °· You have pain in your chest. °· You are coughing up pus-like or bloody mucus. °· You have a stiff neck. °· Your feet or hands swell or have pain. °· Your belly looks bloated. °· Your joints hurt. °This information is not intended to replace advice given to you by your health care provider. Make sure you discuss any questions you have with your health care provider. °Document Released: 06/28/2013 Document Revised: 02/13/2016 Document Reviewed: 04/19/2013 °Elsevier Interactive Patient Education © 2017 [employer] ° °

## 2018-04-29 LAB — CBC WITH DIFFERENTIAL
Basophils Absolute: 0 10*3/uL (ref 0.0–0.2)
Basos: 0 %
EOS (ABSOLUTE): 0.1 10*3/uL (ref 0.0–0.4)
Eos: 1 %
Hematocrit: 39.3 % (ref 37.5–51.0)
Hemoglobin: 12.8 g/dL — ABNORMAL LOW (ref 13.0–17.7)
Immature Grans (Abs): 0 10*3/uL (ref 0.0–0.1)
Immature Granulocytes: 0 %
Lymphocytes Absolute: 3 10*3/uL (ref 0.7–3.1)
Lymphs: 37 %
MCH: 26.7 pg (ref 26.6–33.0)
MCHC: 32.6 g/dL (ref 31.5–35.7)
MCV: 82 fL (ref 79–97)
Monocytes Absolute: 0.8 10*3/uL (ref 0.1–0.9)
Monocytes: 10 %
Neutrophils Absolute: 4.1 10*3/uL (ref 1.4–7.0)
Neutrophils: 52 %
RBC: 4.8 x10E6/uL (ref 4.14–5.80)
RDW: 15.3 % (ref 12.3–15.4)
WBC: 8.1 10*3/uL (ref 3.4–10.8)

## 2018-04-29 LAB — COMPREHENSIVE METABOLIC PANEL
ALT: 15 IU/L (ref 0–44)
AST: 18 IU/L (ref 0–40)
Albumin/Globulin Ratio: 1.7 (ref 1.2–2.2)
Albumin: 4.5 g/dL (ref 3.5–5.5)
Alkaline Phosphatase: 65 IU/L (ref 39–117)
BUN/Creatinine Ratio: 6 — ABNORMAL LOW (ref 9–20)
BUN: 7 mg/dL (ref 6–24)
Bilirubin Total: 0.9 mg/dL (ref 0.0–1.2)
CO2: 24 mmol/L (ref 20–29)
Calcium: 9.5 mg/dL (ref 8.7–10.2)
Chloride: 102 mmol/L (ref 96–106)
Creatinine, Ser: 1.23 mg/dL (ref 0.76–1.27)
GFR calc Af Amer: 83 mL/min/{1.73_m2} (ref >59)
GFR calc non Af Amer: 71 mL/min/{1.73_m2} (ref >59)
Globulin, Total: 2.7 g/dL (ref 1.5–4.5)
Glucose: 75 mg/dL (ref 65–99)
Potassium: 4.5 mmol/L (ref 3.5–5.2)
Sodium: 141 mmol/L (ref 134–144)
Total Protein: 7.2 g/dL (ref 6.0–8.5)

## 2018-04-29 LAB — VITAMIN D 25 HYDROXY (VIT D DEFICIENCY, FRACTURES): Vit D, 25-Hydroxy: 14.9 ng/mL — ABNORMAL LOW (ref 30.0–100.0)

## 2018-04-29 MED ORDER — VITAMIN D (ERGOCALCIFEROL) 1.25 MG (50000 UNIT) PO CAPS: 50000.0000 [IU] | ORAL_CAPSULE | ORAL | 3 refills | Status: DC

## 2018-05-04 ENCOUNTER — Other Ambulatory Visit: Payer: Self-pay | Admitting: Family Medicine

## 2018-05-04 ENCOUNTER — Telehealth: Payer: Self-pay

## 2018-05-04 MED ORDER — VITAMIN D (ERGOCALCIFEROL) 1.25 MG (50000 UNIT) PO CAPS: 50000.0000 [IU] | ORAL_CAPSULE | ORAL | 0 refills | Status: DC

## 2018-05-04 NOTE — Progress Notes (Signed)
Vit d ordered

## 2018-05-04 NOTE — Progress Notes (Signed)
Vit D low. Sent script to the pharmacy.

## 2018-05-04 NOTE — Telephone Encounter (Signed)
-----   Message from Lanae Boast, [place] sent at 05/04/2018 11:42 AM EDT ----- Vit D low. Sent script to the pharmacy.

## 2018-05-04 NOTE — Telephone Encounter (Signed)
Called, no answer and voicemail not set up. Will try later. Thanks!

## 2018-05-05 NOTE — Telephone Encounter (Signed)
called and spoke with patient, advised that vitamin D was low and that we have sent in rx for vitamin D once weekly. Patient verbalized understanding. Thanks!

## 2018-05-06 LAB — CANNABINOID (GC/MS), URINE
Cannabinoid: POSITIVE — AB
Carboxy THC (GC/MS): 29 ng/mL

## 2018-05-06 LAB — 737588 9+OXYCODONE+CRT-UNBUND
Amphetamine Scrn, Ur: NEGATIVE ng/mL
BARBITURATE SCREEN URINE: NEGATIVE ng/mL
BENZODIAZEPINE SCREEN, URINE: NEGATIVE ng/mL
Cocaine (Metab) Scrn, Ur: NEGATIVE ng/mL
Creatinine(Crt), U: 161 mg/dL (ref 20.0–300.0)
Methadone Screen, Urine: NEGATIVE ng/mL
Opiate Scrn, Ur: NEGATIVE ng/mL
Ph of Urine: 6.3 (ref 4.5–8.9)
Phencyclidine Qn, Ur: NEGATIVE ng/mL
Propoxyphene Scrn, Ur: NEGATIVE ng/mL

## 2018-05-06 LAB — OXYCODONE/OXYMORPHONE CONFIRM
OXYCODONE/OXYMORPH: POSITIVE — AB
OXYCODONE: NEGATIVE
OXYMORPHONE CONFIRM: 201 ng/mL
OXYMORPHONE: POSITIVE — AB

## 2018-05-26 ENCOUNTER — Ambulatory Visit: Payer: Self-pay | Admitting: Family Medicine

## 2018-06-06 ENCOUNTER — Encounter: Payer: Self-pay | Admitting: Family Medicine

## 2018-06-06 ENCOUNTER — Ambulatory Visit (INDEPENDENT_AMBULATORY_CARE_PROVIDER_SITE_OTHER): Payer: Medicaid Other | Admitting: Family Medicine

## 2018-06-06 VITALS — BP 141/98 | HR 79 | Temp 98.6°F | Resp 16 | Ht 69.0 in | Wt 165.0 lb

## 2018-06-06 DIAGNOSIS — F4321 Adjustment disorder with depressed mood: Secondary | ICD-10-CM

## 2018-06-06 DIAGNOSIS — D571 Sickle-cell disease without crisis: Secondary | ICD-10-CM | POA: Diagnosis not present

## 2018-06-06 DIAGNOSIS — Z23 Encounter for immunization: Secondary | ICD-10-CM | POA: Diagnosis not present

## 2018-06-06 DIAGNOSIS — I10 Essential (primary) hypertension: Secondary | ICD-10-CM | POA: Diagnosis not present

## 2018-06-06 MED ORDER — AMLODIPINE BESYLATE 5 MG PO TABS: 5.0000 mg | ORAL_TABLET | Freq: Every day | ORAL | 5 refills | Status: DC

## 2018-06-06 MED ORDER — VITAMIN D (ERGOCALCIFEROL) 1.25 MG (50000 UNIT) PO CAPS: 50000.0000 [IU] | ORAL_CAPSULE | ORAL | 2 refills | Status: AC

## 2018-06-06 MED ORDER — FOLIC ACID 1 MG PO TABS: 1.0000 mg | ORAL_TABLET | Freq: Every day | ORAL | 11 refills | Status: DC

## 2018-06-06 MED ORDER — OXYCODONE HCL 5 MG PO TABS: 5.0000 mg | ORAL_TABLET | Freq: Four times a day (QID) | ORAL | 0 refills | Status: DC | PRN

## 2018-06-06 NOTE — Patient Instructions (Signed)
Difference Between Sickle Cell SS and [place]  Key Difference - Sickle Cell SS vs [place] Sickle cell anemia, more commonly referred to Sickle Cell Disease (SCD), is a genetic disease condition which alters the typical shape of the red blood cells (RBC) into a sickle shape, which disrupts the normal functioning of the RBCs. In the context of genetics, a person who is suffering from SCD has inherited two copies of the abnormal hemoglobin gene, one from each parent. The hemoglobin gene is located on chromosome 11. Depending on the type of gene mutation that occurs in chromosome 11, SCD can be of many different sub-types. SCD is considered as an autosomal recessive condition. The individuals with sickle cell trait (AS) inherit two different types of hemoglobin genes; one gene for normal hemoglobin (A) and the other gene for sickle hemoglobin (S). Therefore, the symptoms of SCD (SS) are developed only if the person has inherited two copies of the sickle hemoglobin gene (S), one from each parent. But if a person inherits only a single copy of the abnormal gene (S), the person is referred to as a carrier for the disease which has sickle cell trait (AS). Anemia is the most prominent symptom for SCD. Sickle cell anemia (SS) and Sickle hemoglobic C disease ([place]) are two types of SCD that occurs in an offspring of two parents with abnormal hemoglobin traits. In sickle cell SS, the person inherits two sickle hemoglobin (S) genes from parents, one from each parent while in sickle cell [place], the individual inherits Hemoglobin C gene from one parent and hemoglobin S gene (sickle hemoglobin gene) from the other. This is the key difference between Sickle cell SS and Sickle cell [place]. Both SS and [place] disease conditions develop similar symptoms, but sickle cell [place] develops less severe anemia. CONTENTS 1. Overview and Key Difference 2. What is Sickle Cell SS 3. What is Sickle Cell [place] 4. Similarities Between Sickle Cell SS and [place] 5. Side by  Side Comparison - Sickle Cell SS vs [place] in Tabular Form 6. Summary What is Sickle Cell SS? In the context of SCD, Sickle cell SS or hemoglobin SS disease is the most common type which has the potential to create severe complications in the living system. It is an autosomal recessive disease condition. A person develops sickle cell SS condition by the inheritance of two copies of sickle hemoglobin (S) gene, one from each parent.  Figure 01: Sickle cell SS Disease Inheritance Severe anemic conditions including low levels of hemoglobin are the most common symptoms of sickle cell SS. In sickle cell SS, the typical disc shape of the RBC is changed into a sickle shape; this deformation of RBCs disrupts its primary functions. Other symptoms of Sickle cell SS include fatigue, repeated infections, occurrence of periodic pains and internal organ damage. Iron supplements are not considered to be effective in increasing the levels of hemoglobin present in the blood. What is Sickle Cell [place]? Sickle cell [place] is considered to be the second most common disease condition with regard to SCD. It is developed with the inheritance of hemoglobin C gene from one parent along with a sickle hemoglobin gene (S) from the other. Anemia is the most prominent symptom in sickle cell [place], but it is less severe than in sickle cell SS. Hemoglobin C gene doesn't polymerize as rapidly as sickle hemoglobin (S). Therefore it results in the formation of few sickle cells.  Figure 02: Sickle Cell The symptoms of sickle cell [place] is similar to that  of sickle cell SS. In sickle cell [place], the individuals develop significant retinopathy conditions and bone necrosis. Jaundice may occur occasionally as a symptom. The occurrence of hemoglobin C gene is less than that of Hemoglobin A gene in sickle cell [place]. This disease conditions can cause enlargement of the spleen as well. What are the Similarities Between Sickle Cell SS and [place]? Both share similar symptoms  of SCD including anemic condition. What is the Difference Between Sickle Cell SS and [place]? Sickle Cell SS vs [place]  Sickle cell SS is a type of sickle cell disease which occurs due to the inheritance of two sickle hemoglobin (S) genes; one from each parent. Sickle cell [place] is a type of sickle cell disease which occurs due to the inheritance of one Haemoglobin C gene and one sickle hemoglobin (S) gene from parents.  Anemia  Sickle cell SS develops severe anemic conditions. The anemic conditions developed by Sickle cell [place] is comparatively less severe to that of sickle cell SS.  Summary - Sickle Cell SS vs [place] Sickle cell disease is a genetic disease that disrupts the typical shape of the RBC and affects the normal functioning of the cell. Depending on the type of mutation, SCD can be categorized into many different types. It is an autosomal recessive disease condition. There are two types of SCD: Sickle cell SS and Sickle cell [place]. Sickle cell SS is a type of sickle cell disease which occurs due to the inheritance of two sickle hemoglobin (S) genes, one from each parent. Sickle cell [place] is a type of sickle cell disease which occurs due to the inheritance of one hemoglobin C gene and one sickle hemoglobin (S) gene from parents. Both disease conditions develop similar symptoms although sickle cell SS anemic condition is more severe when compared to sickle cell [place]. This is the difference between sickle cell SS and sickle cell [place]. If a person receives only one sickle hemoglobin gene (S) and a normal hemoglobin gene (A), that person is referred to as carrier to the disease. Download PDF Version of Sickle Cell SS vs [place] You can download PDF version of this article and use it for offline purposes as per citation note. Please download PDF version here Difference Between Sickle Cell SS and  References: 1. "What Is Sickle Cell Disease?" National Heart Lung and [place], U.S. Department of Health and [employer],  10 Aug. 2017, Available here. Accessed 4 Sept. 2017. 2. "What Is Sickle Cell." The Sickle Cell Foundation of [place], Available here. Accessed 4 Sept. 2017. Image Courtesy: 1. "Sickle cell 02" By National Heart Lung and Blood Insitute (NIH) - National Heart Lung and Blood Insitute (NIH) (Public Domain) via [employer] 2. "1911 Sickle Cells" By [employer] [facility], [employer] Web site. Available here, Mar 09, 2012. (CC BY 3.0) via [place]

## 2018-06-06 NOTE — Progress Notes (Signed)
PATIENT CARE CENTER INTERNAL MEDICINE AND SICKLE CELL CARE  SICKLE CELL ANEMIA FOLLOW UP VISIT PROVIDER: Lanae Boast, FNP    Subjective:   Ronald Hurst  is a 42 y.o.  male who  has a past medical history of Essential hypertension and Sickle cell anemia ([place]). presents for a follow up for Sickle Cell Anemia. his last hospitalization was 03/2018  he has had 1 hospitalizations in the past 12 months.  Pain regimen includes: Ibuprofen and roxicodone 5mg  [id number] Hydrea Therapy: No Medication compliance: No Patient is non compliant with amlodipine.   Pain today is 4/10 and described as a muscle cramp on the left side of the abdomen.  Patient reports adequate daily hydration.   Patient states that he is not taking the amlodipine daily due to forgetting. No side effects reported.  Patient states that he is currently in counseling due to the loss of his brother in 2017.   Review of Systems  Constitutional: Negative.   HENT: Negative.   Eyes: Negative.   Respiratory: Negative.   Cardiovascular: Negative.   Gastrointestinal: Negative.   Genitourinary: Negative.   Musculoskeletal: Positive for myalgias (left side pain).  Skin: Negative.   Neurological: Negative.   Psychiatric/Behavioral: Negative.     Objective:   Objective  BP (!) 141/98 (BP Location: Left Arm, Patient Position: Sitting, Cuff Size: Normal)   Pulse 79   Temp 98.6 F (37 C) (Oral)   Resp 16   Ht 5\' 9"  (1.753 m)   Wt 165 lb (74.8 kg)   SpO2 100%   BMI 24.37 kg/m    Physical Exam  Constitutional: He is oriented to person, place, and time. He appears well-developed and well-nourished. No distress.  HENT:  Head: Normocephalic and atraumatic.  Eyes: Pupils are equal, round, and reactive to light. Conjunctivae and EOM are normal.  Neck: Normal range of motion.  Cardiovascular: Normal rate, regular rhythm, normal heart sounds and intact distal pulses.  Pulmonary/Chest: Effort normal and breath sounds  normal. No respiratory distress.  Abdominal: Soft. Bowel sounds are normal. He exhibits no distension.  Musculoskeletal: Normal range of motion.  Neurological: He is alert and oriented to person, place, and time.  Skin: Skin is warm and dry.  Psychiatric: He has a normal mood and affect. His behavior is normal. Thought content normal.  Nursing note and vitals reviewed.    Assessment/Plan:   Assessment   Encounter Diagnosis  Name Primary?  . Flu vaccine need Yes     Plan  1. Flu vaccine need .Influenza vaccination given in the office visit today. - Flu Vaccine QUAD 6+ mos PF IM (Fluarix Quad PF)  2. Essential hypertension The patient is asked to make an attempt to improve diet and exercise patterns to aid in medical management of this problem. - amLODipine (NORVASC) 5 MG tablet; Take 1 tablet (5 mg total) by mouth daily.  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  Dispense: 30 tablet; Refill: 11 - oxyCODONE (OXY IR/ROXICODONE) 5 MG immediate release tablet; Take 1 tablet (5 mg total) by mouth every 6 (six) hours as needed for up to 15 days for moderate pain or severe pain.  Dispense: 60 tablet; Refill: 0 - CBC with Differential - Comprehensive metabolic panel  4. Unresolved grief Encouraged the continuation of psychotherapy. Discussed adding an SSRI to aid in depressive symptoms of feelings of sadness, anger,  anhedonia. Patient declined at the present time. He states that he would like to "give counseling a chance" first.  Denies SI, HI, AH, VH.    Return to care as scheduled and prn. Patient verbalized understanding and agreed with plan of care.   1. Sickle cell disease -  We discussed the need for good hydration, monitoring of hydration status, avoidance of heat, cold, stress, and infection triggers. We discussed the risks and benefits of Hydrea, including bone marrow  suppression, the possibility of GI upset, skin ulcers, hair thinning, and teratogenicity. The patient was reminded of the need to seek medical attention of any symptoms of bleeding, anemia, or infection. Continue folic acid 1 mg daily to prevent aplastic bone marrow crises.   2. Pulmonary evaluation - Patient denies severe recurrent wheezes, shortness of breath with exercise, or persistent cough. If these symptoms develop, pulmonary function tests with spirometry will be ordered, and if abnormal, plan on referral to Pulmonology for further evaluation.  3. Cardiac - Routine screening for pulmonary hypertension is not recommended.  4. Eye - High risk of proliferative retinopathy. Annual eye exam with retinal exam recommended to patient.  5. Immunization status -  Yearly influenza vaccination is recommended, as well as being up to date with Meningococcal and Pneumococcal vaccines.   6. Acute and chronic painful episodes - We discussed that pt is to receive Schedule II prescriptions only from [place]. Pt is also aware that the prescription history is available to [place] online through the [facility] CSRS. Controlled substance agreement signed .Marland Kitchen We reminded Ronald Hurst that all patients receiving Schedule II narcotics must be seen for follow within one month of prescription being requested. We reviewed the terms of our pain agreement, including the need to keep medicines in a safe locked location away from children or pets, and the need to report excess sedation or constipation, measures to avoid constipation, and policies related to early refills and stolen prescriptions. According to the [place] Chronic Pain Initiative program, we have reviewed details related to analgesia, adverse effects, aberrant behaviors.  7. Iron overload from chronic transfusion.  Not applicable at this time.  If this occurs will use Exjade for management.   8. Vitamin D deficiency - Drisdol 50,000 units weekly. Patient encouraged to take as  prescribed.   The above recommendations are taken from the NIH Evidence-Based Management of Sickle Cell Disease: Expert Panel Report, 20149.   Ms. Ronald L. Nathaneil Canary, FNP-BC Patient [facility] [address] [place], [place] [address] [phone]  This note has been created with Dragon speech recognition software and smart phrase technology. Any transcriptional errors are unintentional.

## 2018-06-07 LAB — COMPREHENSIVE METABOLIC PANEL
ALT: 29 IU/L (ref 0–44)
AST: 32 IU/L (ref 0–40)
Albumin/Globulin Ratio: 1.7 (ref 1.2–2.2)
Albumin: 4.2 g/dL (ref 3.5–5.5)
Alkaline Phosphatase: 72 IU/L (ref 39–117)
BUN/Creatinine Ratio: 9 (ref 9–20)
BUN: 11 mg/dL (ref 6–24)
Bilirubin Total: 0.8 mg/dL (ref 0.0–1.2)
CO2: 23 mmol/L (ref 20–29)
Calcium: 9.1 mg/dL (ref 8.7–10.2)
Chloride: 106 mmol/L (ref 96–106)
Creatinine, Ser: 1.25 mg/dL (ref 0.76–1.27)
GFR calc Af Amer: 81 mL/min/{1.73_m2} (ref >59)
GFR calc non Af Amer: 70 mL/min/{1.73_m2} (ref >59)
Globulin, Total: 2.5 g/dL (ref 1.5–4.5)
Glucose: 88 mg/dL (ref 65–99)
Potassium: 4.2 mmol/L (ref 3.5–5.2)
Sodium: 145 mmol/L — ABNORMAL HIGH (ref 134–144)
Total Protein: 6.7 g/dL (ref 6.0–8.5)

## 2018-06-07 LAB — CBC WITH DIFFERENTIAL/PLATELET
Basophils Absolute: 0 10*3/uL (ref 0.0–0.2)
Basos: 0 %
EOS (ABSOLUTE): 0.2 10*3/uL (ref 0.0–0.4)
Eos: 2 %
Hematocrit: 38.9 % (ref 37.5–51.0)
Hemoglobin: 12.8 g/dL — ABNORMAL LOW (ref 13.0–17.7)
Immature Grans (Abs): 0 10*3/uL (ref 0.0–0.1)
Immature Granulocytes: 0 %
Lymphocytes Absolute: 3.6 10*3/uL — ABNORMAL HIGH (ref 0.7–3.1)
Lymphs: 29 %
MCH: 26.3 pg — ABNORMAL LOW (ref 26.6–33.0)
MCHC: 32.9 g/dL (ref 31.5–35.7)
MCV: 80 fL (ref 79–97)
Monocytes Absolute: 1.2 10*3/uL — ABNORMAL HIGH (ref 0.1–0.9)
Monocytes: 10 %
Neutrophils Absolute: 7.3 10*3/uL — ABNORMAL HIGH (ref 1.4–7.0)
Neutrophils: 59 %
Platelets: 454 10*3/uL — ABNORMAL HIGH (ref 150–450)
RBC: 4.86 x10E6/uL (ref 4.14–5.80)
RDW: 16 % — ABNORMAL HIGH (ref 12.3–15.4)
WBC: 12.4 10*3/uL — ABNORMAL HIGH (ref 3.4–10.8)

## 2018-08-08 ENCOUNTER — Other Ambulatory Visit: Payer: Self-pay

## 2018-08-08 ENCOUNTER — Ambulatory Visit (INDEPENDENT_AMBULATORY_CARE_PROVIDER_SITE_OTHER): Payer: Medicaid Other | Admitting: Family Medicine

## 2018-08-08 VITALS — BP 142/98 | HR 88 | Temp 98.2°F | Ht 69.0 in | Wt 167.0 lb

## 2018-08-08 DIAGNOSIS — I498 Other specified cardiac arrhythmias: Secondary | ICD-10-CM | POA: Diagnosis not present

## 2018-08-08 DIAGNOSIS — D571 Sickle-cell disease without crisis: Secondary | ICD-10-CM | POA: Diagnosis not present

## 2018-08-08 DIAGNOSIS — R55 Syncope and collapse: Secondary | ICD-10-CM | POA: Diagnosis not present

## 2018-08-08 LAB — POCT URINALYSIS DIP (MANUAL ENTRY)
Bilirubin, UA: NEGATIVE
Blood, UA: NEGATIVE
Glucose, UA: NEGATIVE mg/dL
Ketones, POC UA: NEGATIVE mg/dL
Leukocytes, UA: NEGATIVE
Nitrite, UA: NEGATIVE
Protein Ur, POC: NEGATIVE mg/dL
Spec Grav, UA: 1.015 (ref 1.010–1.025)
Urobilinogen, UA: 0.2 E.U./dL
pH, UA: 6 (ref 5.0–8.0)

## 2018-08-08 MED ORDER — IBUPROFEN 600 MG PO TABS: 600.0000 mg | ORAL_TABLET | Freq: Three times a day (TID) | ORAL | 2 refills | Status: DC | PRN

## 2018-08-08 MED ORDER — OXYCODONE HCL 5 MG PO TABS: 5.0000 mg | ORAL_TABLET | Freq: Four times a day (QID) | ORAL | 0 refills | Status: DC | PRN

## 2018-08-08 NOTE — Progress Notes (Signed)
PATIENT CARE CENTER INTERNAL MEDICINE AND SICKLE CELL CARE  SICKLE CELL ANEMIA FOLLOW UP VISIT PROVIDER: Lanae Boast, FNP    Subjective:   Ronald Hurst  is a 42 y.o.  male who  has a past medical history of Essential hypertension and Sickle cell anemia ([place]). presents for a follow up for Sickle Cell Anemia. his last hospitalization was 04/18/2018. he has had 1 hospitalizations in the past 6 months.  Pain regimen includes: Ibuprofen and oxycodone Hydrea Therapy: No Medication compliance: Yes  Pain today is 2/10- patient states that he is sore after a fall. States that he "passed out and woke up on the pavement". Patient state  The patient reports adequate daily hydration.     Review of Systems  Constitutional: Negative.   HENT: Negative.   Eyes: Negative.   Respiratory: Negative.   Cardiovascular: Negative.   Gastrointestinal: Negative.   Genitourinary: Negative.   Musculoskeletal: Negative.   Skin: Negative.   Neurological: Negative.        Fainting   Psychiatric/Behavioral: Negative.     Objective:   Objective  BP (!) 142/98 (BP Location: Right Arm, Patient Position: Sitting, Cuff Size: Small)   Pulse 88   Temp 98.2 F (36.8 C) (Oral)   Ht 5\' 9"  (1.753 m)   Wt 167 lb (75.8 kg)   SpO2 97%   BMI 24.66 kg/m    Physical Exam  Constitutional: He is oriented to person, place, and time. He appears well-developed and well-nourished. No distress.  HENT:  Head: Normocephalic and atraumatic.  Eyes: Pupils are equal, round, and reactive to light. Conjunctivae and EOM are normal.  Neck: Normal range of motion.  Cardiovascular: Normal rate, regular rhythm, normal heart sounds and intact distal pulses.  Pulmonary/Chest: Effort normal and breath sounds normal. No respiratory distress.  Abdominal: Soft. Bowel sounds are normal. He exhibits no distension.  Musculoskeletal: Normal range of motion.  Neurological: He is alert and oriented to person, place, and  time.  Skin: Skin is warm and dry.  Psychiatric: He has a normal mood and affect. His behavior is normal. Thought content normal.  Nursing note and vitals reviewed.    Assessment/Plan:   Assessment   Encounter Diagnoses  Name Primary?  Marland Kitchen Hb-SS disease without crisis ([place]) Yes  . Syncope, unspecified syncope type      Plan  1. Hb-SS disease without crisis ([place]) - oxyCODONE (OXY IR/ROXICODONE) 5 MG immediate release tablet; Take 1 tablet (5 mg total) by mouth every 6 (six) hours as needed for up to 15 days for moderate pain or severe pain.  Dispense: 60 tablet; Refill: 0 - CBC with Differential - Basic Metabolic Panel - POCT urinalysis dipstick - [id number]+OXYCODONE+CRT-UNBUND - ibuprofen (ADVIL,MOTRIN) 600 MG tablet; Take 1 tablet (600 mg total) by mouth every 8 (eight) hours as needed for moderate pain.  Dispense: 30 tablet; Refill: 2  2. Syncope, unspecified syncope type EKG with normal sinus rhythm, sinus arrhythmia. Will refer to cardiology for further evaluation. This is a change from the last EKG on 04/18/2018.    3. Sinus arrhythmia seen on electrocardiogram - Ambulatory referral to Cardiology for further evaluation.    Return to care as scheduled and prn. Patient verbalized understanding and agreed with plan of care.   1. Sickle cell disease - We discussed the need for good hydration, monitoring of hydration status, avoidance of heat, cold, stress, and infection triggers. We discussed the risks and benefits of Hydrea, including bone marrow suppression, the possibility  of GI upset, skin ulcers, hair thinning, and teratogenicity. The patient was reminded of the need to seek medical attention of any symptoms of bleeding, anemia, or infection. Continue folic acid 1 mg daily to prevent aplastic bone marrow crises.   2. Pulmonary evaluation - Patient denies severe recurrent wheezes, shortness of breath with exercise, or persistent cough. If these symptoms develop, pulmonary  function tests with spirometry will be ordered, and if abnormal, plan on referral to Pulmonology for further evaluation.  3. Cardiac - Routine screening for pulmonary hypertension is not recommended.  4. Eye - High risk of proliferative retinopathy. Annual eye exam with retinal exam recommended to patient.  5. Immunization status -  Yearly influenza vaccination is recommended, as well as being up to date with Meningococcal and Pneumococcal vaccines.   6. Acute and chronic painful episodes - We discussed that pt is to receive Schedule II prescriptions only from [place]. Pt is also aware that the prescription history is available to [place] online through the [facility] CSRS. Controlled substance agreement signed. We reminded Damaria Stofko that all patients receiving Schedule II narcotics must be seen for follow within one month of prescription being requested. We reviewed the terms of our pain agreement, including the need to keep medicines in a safe locked location away from children or pets, and the need to report excess sedation or constipation, measures to avoid constipation, and policies related to early refills and stolen prescriptions. According to the [place] Chronic Pain Initiative program, we have reviewed details related to analgesia, adverse effects, aberrant behaviors.  7. Iron overload from chronic transfusion.  Not applicable at this time.  If this occurs will use Exjade for management.   8. Vitamin D deficiency - Drisdol 50,000 units weekly. Patient encouraged to take as prescribed.   The above recommendations are taken from the NIH Evidence-Based Management of Sickle Cell Disease: Expert Panel Report, 20149.   [name], FNP-BC Patient [facility] [address] [place], [place] [address] [phone]  This note has been created with Dragon speech recognition software and smart phrase technology. Any transcriptional errors are unintentional.

## 2018-08-08 NOTE — Patient Instructions (Signed)
Syncope Syncope is when you lose temporarily pass out (faint). Signs that you may be about to pass out include:  Feeling dizzy or light-headed.  Feeling sick to your stomach (nauseous).  Seeing all white or all black.  Having cold, clammy skin.  If you passed out, get help right away. Call your local emergency services (911 in the [place] Do not drive yourself to the hospital. Follow these instructions at home: Pay attention to any changes in your symptoms. Take these actions to help with your condition:  Have someone stay with you until you feel stable.  Do not drive, use machinery, or play sports until your doctor says it is okay.  Keep all follow-up visits as told by your doctor. This is important.  If you start to feel like you might pass out, lie down right away and raise (elevate) your feet above the level of your heart. Breathe deeply and steadily. Wait until all of the symptoms are gone.  Drink enough fluid to keep your pee (urine) clear or pale yellow.  If you are taking blood pressure or heart medicine, get up slowly and spend many minutes getting ready to sit and then stand. This can help with dizziness.  Take over-the-counter and prescription medicines only as told by your doctor.  Get help right away if:  You have a very bad headache.  You have unusual pain in your chest, tummy, or back.  You are bleeding from your mouth or rectum.  You have black or tarry poop (stool).  You have a very fast or uneven heartbeat (palpitations).  It hurts to breathe.  You pass out once or more than once.  You have jerky movements that you cannot control (seizure).  You are confused.  You have trouble walking.  You are very weak.  You have vision problems. These symptoms may be an emergency. Do not wait to see if the symptoms will go away. Get medical help right away. Call your local emergency services (911 in the [place] Do not drive yourself to the hospital. This  information is not intended to replace advice given to you by your health care provider. Make sure you discuss any questions you have with your health care provider. Document Released: 02/24/2008 Document Revised: 02/13/2016 Document Reviewed: 05/22/2015 Elsevier Interactive Patient Education  2018 [place]. Sickle Cell Anemia, Adult Sickle cell anemia is a condition where your red blood cells are shaped like sickles. Red blood cells carry oxygen through the body. Sickle-shaped red blood cells do not live as long as normal red blood cells. They also clump together and block blood from flowing through the blood vessels. These things prevent the body from getting enough oxygen. Sickle cell anemia causes organ damage and pain. It also increases the risk of infection. Follow these instructions at home:  Drink enough fluid to keep your pee (urine) clear or pale yellow. Drink more in hot weather and during exercise.  Do not smoke. Smoking lowers oxygen levels in the blood.  Only take over-the-counter or prescription medicines as told by your doctor.  Take antibiotic medicines as told by your doctor. Make sure you finish them even if you start to feel better.  Take supplements as told by your doctor.  Consider wearing a medical alert bracelet. This tells anyone caring for you in an emergency of your condition.  When traveling, keep your medical information, doctors' names, and the medicines you take with you at all times.  If you have a fever,  do not take fever medicines right away. This could cover up a problem. Tell your doctor.  Keep all follow-up visits with your doctor. Sickle cell anemia requires regular medical care. Contact a doctor if: You have a fever. Get help right away if:  You feel dizzy or faint.  You have new belly (abdominal) pain, especially on the left side near the stomach area.  You have a lasting, often uncomfortable and painful erection of the penis (priapism). If  it is not treated right away, you will become unable to have sex (impotence).  You have numbness in your arms or legs or you have a hard time moving them.  You have a hard time talking.  You have a fever or lasting symptoms for more than 2-3 days.  You have a fever and your symptoms suddenly get worse.  You have signs or symptoms of infection. These include: ? Chills. ? Being more tired than normal (lethargy). ? Irritability. ? Poor eating. ? Throwing up (vomiting).  You have pain that is not helped with medicine.  You have shortness of breath.  You have pain in your chest.  You are coughing up pus-like or bloody mucus.  You have a stiff neck.  Your feet or hands swell or have pain.  Your belly looks bloated.  Your joints hurt. This information is not intended to replace advice given to you by your health care provider. Make sure you discuss any questions you have with your health care provider. Document Released: 06/28/2013 Document Revised: 02/13/2016 Document Reviewed: 04/19/2013 Elsevier Interactive Patient Education  2017 [employer].

## 2018-08-09 LAB — BASIC METABOLIC PANEL
BUN/Creatinine Ratio: 6 — ABNORMAL LOW (ref 9–20)
BUN: 8 mg/dL (ref 6–24)
CO2: 25 mmol/L (ref 20–29)
Calcium: 9.2 mg/dL (ref 8.7–10.2)
Chloride: 103 mmol/L (ref 96–106)
Creatinine, Ser: 1.25 mg/dL (ref 0.76–1.27)
GFR calc Af Amer: 81 mL/min/{1.73_m2} (ref >59)
GFR calc non Af Amer: 70 mL/min/{1.73_m2} (ref >59)
Glucose: 89 mg/dL (ref 65–99)
Potassium: 3.7 mmol/L (ref 3.5–5.2)
Sodium: 143 mmol/L (ref 134–144)

## 2018-08-09 LAB — CBC WITH DIFFERENTIAL/PLATELET
Basophils Absolute: 0.1 10*3/uL (ref 0.0–0.2)
Basos: 1 %
EOS (ABSOLUTE): 0.1 10*3/uL (ref 0.0–0.4)
Eos: 2 %
Hematocrit: 39.2 % (ref 37.5–51.0)
Hemoglobin: 12.7 g/dL — ABNORMAL LOW (ref 13.0–17.7)
Immature Grans (Abs): 0 10*3/uL (ref 0.0–0.1)
Immature Granulocytes: 0 %
Lymphocytes Absolute: 2.8 10*3/uL (ref 0.7–3.1)
Lymphs: 35 %
MCH: 26.5 pg — ABNORMAL LOW (ref 26.6–33.0)
MCHC: 32.4 g/dL (ref 31.5–35.7)
MCV: 82 fL (ref 79–97)
Monocytes Absolute: 1 10*3/uL — ABNORMAL HIGH (ref 0.1–0.9)
Monocytes: 13 %
Neutrophils Absolute: 4 10*3/uL (ref 1.4–7.0)
Neutrophils: 49 %
Platelets: 463 10*3/uL — ABNORMAL HIGH (ref 150–450)
RBC: 4.79 x10E6/uL (ref 4.14–5.80)
RDW: 16.1 % — ABNORMAL HIGH (ref 12.3–15.4)
WBC: 8 10*3/uL (ref 3.4–10.8)

## 2018-08-10 ENCOUNTER — Encounter: Payer: Self-pay | Admitting: Family Medicine

## 2018-08-10 NOTE — Progress Notes (Signed)
Your labs are stable. Continue with your current medications. Please remember to keep your follow up appointment. If you have problems, questions or concerns, please make an appointment to discuss. Thanks!

## 2018-08-13 LAB — 737588 9+OXYCODONE+CRT-UNBUND
Amphetamine Scrn, Ur: NEGATIVE ng/mL
BARBITURATE SCREEN URINE: NEGATIVE ng/mL
BENZODIAZEPINE SCREEN, URINE: NEGATIVE ng/mL
Cocaine (Metab) Scrn, Ur: NEGATIVE ng/mL
Creatinine(Crt), U: 167.4 mg/dL (ref 20.0–300.0)
Methadone Screen, Urine: NEGATIVE ng/mL
OXYCODONE+OXYMORPHONE UR QL SCN: NEGATIVE ng/mL
Opiate Scrn, Ur: NEGATIVE ng/mL
Ph of Urine: 6.3 (ref 4.5–8.9)
Phencyclidine Qn, Ur: NEGATIVE ng/mL
Propoxyphene Scrn, Ur: NEGATIVE ng/mL

## 2018-08-13 LAB — CANNABINOID (GC/MS), URINE
Cannabinoid: POSITIVE — AB
Carboxy THC (GC/MS): 23 ng/mL

## 2018-09-05 ENCOUNTER — Ambulatory Visit: Payer: Medicaid Other | Admitting: Family Medicine

## 2018-09-09 ENCOUNTER — Ambulatory Visit: Payer: Medicaid Other | Admitting: Family Medicine

## 2018-10-17 ENCOUNTER — Encounter: Payer: Self-pay | Admitting: Family Medicine

## 2018-10-17 ENCOUNTER — Ambulatory Visit (INDEPENDENT_AMBULATORY_CARE_PROVIDER_SITE_OTHER): Payer: Medicaid Other | Admitting: Family Medicine

## 2018-10-17 VITALS — BP 122/80 | HR 88 | Temp 98.5°F | Resp 14 | Ht 69.0 in | Wt 169.0 lb

## 2018-10-17 DIAGNOSIS — D571 Sickle-cell disease without crisis: Secondary | ICD-10-CM | POA: Diagnosis not present

## 2018-10-17 DIAGNOSIS — I10 Essential (primary) hypertension: Secondary | ICD-10-CM

## 2018-10-17 LAB — POCT URINALYSIS DIPSTICK
Bilirubin, UA: NEGATIVE
Blood, UA: NEGATIVE
Glucose, UA: NEGATIVE
Ketones, UA: NEGATIVE
Leukocytes, UA: NEGATIVE
Nitrite, UA: NEGATIVE
Protein, UA: NEGATIVE
Spec Grav, UA: 1.01 (ref 1.010–1.025)
Urobilinogen, UA: 0.2 E.U./dL
pH, UA: 6 (ref 5.0–8.0)

## 2018-10-17 MED ORDER — OXYCODONE HCL 5 MG PO TABS: 5.0000 mg | ORAL_TABLET | Freq: Four times a day (QID) | ORAL | 0 refills | Status: DC | PRN

## 2018-10-17 MED ORDER — FOLIC ACID 1 MG PO TABS: 1.0000 mg | ORAL_TABLET | Freq: Every day | ORAL | 11 refills | Status: DC

## 2018-10-17 MED ORDER — IBUPROFEN 600 MG PO TABS: 600.0000 mg | ORAL_TABLET | Freq: Three times a day (TID) | ORAL | 2 refills | Status: DC | PRN

## 2018-10-17 NOTE — Progress Notes (Signed)
PATIENT [facility]ARE CENTER INTERNAL MEDICINE AND SICKLE CELL CARE  SICKLE CELL ANEMIA FOLLOW UP VISIT PROVIDER: Lanae Boast, FNP    Subjective:   Ronald Hurst  is a 43 y.o.  male who  has a past medical history of Essential hypertension and Sickle cell anemia ([place]). presents for a follow up for Sickle Cell Anemia. The patient has had 1 admissions in the past 6 months.  Pain regimen includes: Ibuprofen and roxicodone 5 mg Hydrea Therapy: No Medication compliance: Yes  Pain today is 0/10 The patient reports adequate daily hydration.     Review of Systems  Constitutional: Negative.   HENT: Negative.   Eyes: Negative.   Respiratory: Negative.   Cardiovascular: Negative.   Gastrointestinal: Negative.   Genitourinary: Negative.   Musculoskeletal: Negative.   Skin: Negative.   Neurological: Negative.   Psychiatric/Behavioral: Negative.     Objective:   Objective  BP 122/80 (BP Location: Left Arm, Patient Position: Sitting, Cuff Size: Normal)   Pulse 88   Temp 98.5 F (36.9 C) (Oral)   Resp 14   Ht 5\' 9"  (1.753 m)   Wt 169 lb (76.7 kg)   SpO2 99%   BMI 24.96 kg/m   Wt Readings from Last 3 Encounters:  10/17/18 169 lb (76.7 kg)  08/08/18 167 lb (75.8 kg)  06/06/18 165 lb (74.8 kg)     Physical Exam Vitals signs and nursing note reviewed.  Constitutional:      General: He is not in acute distress.    Appearance: He is well-developed.  HENT:     Head: Normocephalic and atraumatic.  Eyes:     Conjunctiva/sclera: Conjunctivae normal.     Pupils: Pupils are equal, round, and reactive to light.  Neck:     Musculoskeletal: Normal range of motion.  Cardiovascular:     Rate and Rhythm: Normal rate and regular rhythm.     Heart sounds: Normal heart sounds.  Pulmonary:     Effort: Pulmonary effort is normal. No respiratory distress.     Breath sounds: Normal breath sounds.  Abdominal:     General: Bowel sounds are normal. There is no distension.   Palpations: Abdomen is soft.  Musculoskeletal: Normal range of motion.  Skin:    General: Skin is warm and dry.  Neurological:     Mental Status: He is alert and oriented to person, place, and time.  Psychiatric:        Behavior: Behavior normal.        Thought Content: Thought content normal.      Assessment/Plan:   Assessment   Encounter Diagnoses  Name Primary?  . Essential hypertension Yes  . Hb-SS disease without crisis ([place])      Plan  1. Essential hypertension The current medical regimen is effective;  continue present plan and medications.  - Urinalysis Dipstick  2. Hb-SS disease without crisis ([place]) Will continue to monitor. - folic acid (FOLVITE) 1 MG tablet; Take 1 tablet (1 mg total) by mouth daily.  Dispense: 30 tablet; Refill: 11 - ibuprofen (ADVIL,MOTRIN) 600 MG tablet; Take 1 tablet (600 mg total) by mouth every 8 (eight) hours as needed for moderate pain.  Dispense: 30 tablet; Refill: 2 - oxyCODONE (OXY IR/ROXICODONE) 5 MG immediate release tablet; Take 1 tablet (5 mg total) by mouth every 6 (six) hours as needed for up to 15 days for moderate pain or severe pain.  Dispense: 60 tablet; Refill: 0   Return to care as scheduled and prn. Patient  verbalized understanding and agreed with plan of care.   1. Sickle cell disease -  We discussed the need for good hydration, monitoring of hydration status, avoidance of heat, cold, stress, and infection triggers. We discussed the risks and benefits of Hydrea, including bone marrow suppression, the possibility of GI upset, skin ulcers, hair thinning, and teratogenicity. The patient was reminded of the need to seek medical attention of any symptoms of bleeding, anemia, or infection. Continue folic acid 1 mg daily to prevent aplastic bone marrow crises.   2. Pulmonary evaluation - Patient denies severe recurrent wheezes, shortness of breath with exercise, or persistent cough. If these symptoms develop, pulmonary function  tests with spirometry will be ordered, and if abnormal, plan on referral to Pulmonology for further evaluation.  3. Cardiac - Routine screening for pulmonary hypertension is not recommended.  4. Eye - High risk of proliferative retinopathy. Annual eye exam with retinal exam recommended to patient.  5. Immunization status -  Yearly influenza vaccination is recommended, as well as being up to date with Meningococcal and Pneumococcal vaccines.   6. Acute and chronic painful episodes - We discussed that pt is to receive Schedule II prescriptions only from [place]. Pt is also aware that the prescription history is available to [place] online through the [facility] CSRS. Controlled substance agreement signed. We reminded Ronald Hurst that all patients receiving Schedule II narcotics must be seen for follow within one month of prescription being requested. We reviewed the terms of our pain agreement, including the need to keep medicines in a safe locked location away from children or pets, and the need to report excess sedation or constipation, measures to avoid constipation, and policies related to early refills and stolen prescriptions. According to the [place] Chronic Pain Initiative program, we have reviewed details related to analgesia, adverse effects, aberrant behaviors.  7. Iron overload from chronic transfusion.  Not applicable at this time.  If this occurs will use Exjade for management.   8. Vitamin D deficiency - Drisdol 50,000 units weekly. Patient encouraged to take as prescribed.   The above recommendations are taken from the NIH Evidence-Based Management of Sickle Cell Disease: Expert Panel Report, 20149.   [name], FNP-BC [facility] [address] [place], [place] [address] [phone]  This note has been created with Dragon speech recognition software and smart phrase technology. Any transcriptional errors are unintentional.

## 2018-10-17 NOTE — Patient Instructions (Signed)
Sickle Cell Anemia, Adult °Sickle cell anemia is a condition where your red blood cells are shaped like sickles. Red blood cells carry oxygen through the body. Sickle-shaped cells do not live as long as normal red blood cells. They also clump together and block blood from flowing through the blood vessels. This prevents the body from getting enough oxygen. Sickle cell anemia causes organ damage and pain. It also increases the risk of infection. °Follow these instructions at home: °Medicines °· Take over-the-counter and prescription medicines only as told by your doctor. °· If you were prescribed an antibiotic medicine, take it as told by your doctor. Do not stop taking the antibiotic even if you start to feel better. °· If you develop a fever, do not take medicines to lower the fever right away. Tell your doctor about the fever. °Managing pain, stiffness, and swelling °· Try these methods to help with pain: °? Use a heating pad. °? Take a warm bath. °? Distract yourself, such as by watching TV. °Eating and drinking °· Drink enough fluid to keep your pee (urine) clear or pale yellow. Drink more in hot weather and during exercise. °· Limit or avoid alcohol. °· Eat a healthy diet. Eat plenty of fruits, vegetables, whole grains, and lean protein. °· Take vitamins and supplements as told by your doctor. °Traveling °· When traveling, keep these with you: °? Your medical information. °? The names of your doctors. °? Your medicines. °· If you need to take an airplane, talk to your doctor first. °Activity °· Rest often. °· Avoid exercises that make your heart beat much faster, such as jogging. °General instructions °· Do not use products that have nicotine or tobacco, such as cigarettes and e-cigarettes. If you need help quitting, ask your doctor. °· Consider wearing a medical alert bracelet. °· Avoid being in high places (high altitudes), such as mountains. °· Avoid very hot or cold temperatures. °· Avoid places where the  temperature changes a lot. °· Keep all follow-up visits as told by your doctor. This is important. °Contact a doctor if: °· A joint hurts. °· Your feet or hands hurt or swell. °· You feel tired (fatigued). °Get help right away if: °· You have symptoms of infection. These include: °? Fever. °? Chills. °? Being very tired. °? Irritability. °? Poor eating. °? Throwing up (vomiting). °· You feel dizzy or faint. °· You have new stomach pain, especially on the left side. °· You have a an erection (priapism) that lasts more than 4 hours. °· You have numbness in your arms or legs. °· You have a hard time moving your arms or legs. °· You have trouble talking. °· You have pain that does not go away when you take medicine. °· You are short of breath. °· You are breathing fast. °· You have a long-term cough. °· You have pain in your chest. °· You have a bad headache. °· You have a stiff neck. °· Your stomach looks bloated even though you did not eat much. °· Your skin is pale. °· You suddenly cannot see well. °Summary °· Sickle cell anemia is a condition where your red blood cells are shaped like sickles. °· Follow your doctor's advice on ways to manage pain, food to eat, activities to do, and steps to take for safe travel. °· Get medical help right away if you have any signs of infection, such as a fever. °This information is not intended to replace advice given to you by your   health care provider. Make sure you discuss any questions you have with your health care provider. °Document Released: 06/28/2013 Document Revised: 10/13/2016 Document Reviewed: 10/13/2016 °Elsevier Interactive Patient Education © 2019 Elsevier Inc. ° °

## 2018-10-18 LAB — BASIC METABOLIC PANEL
BUN/Creatinine Ratio: 7 — ABNORMAL LOW (ref 9–20)
BUN: 10 mg/dL (ref 6–24)
CO2: 22 mmol/L (ref 20–29)
Calcium: 9.2 mg/dL (ref 8.7–10.2)
Chloride: 107 mmol/L — ABNORMAL HIGH (ref 96–106)
Creatinine, Ser: 1.35 mg/dL — ABNORMAL HIGH (ref 0.76–1.27)
GFR calc Af Amer: 74 mL/min/{1.73_m2} (ref >59)
GFR calc non Af Amer: 64 mL/min/{1.73_m2} (ref >59)
Glucose: 81 mg/dL (ref 65–99)
Potassium: 4.2 mmol/L (ref 3.5–5.2)
Sodium: 143 mmol/L (ref 134–144)

## 2018-10-18 LAB — CBC WITH DIFFERENTIAL/PLATELET
Basophils Absolute: 0.1 10*3/uL (ref 0.0–0.2)
Basos: 1 %
EOS (ABSOLUTE): 0.1 10*3/uL (ref 0.0–0.4)
Eos: 1 %
Hematocrit: 37.4 % — ABNORMAL LOW (ref 37.5–51.0)
Hemoglobin: 12.8 g/dL — ABNORMAL LOW (ref 13.0–17.7)
Immature Grans (Abs): 0 10*3/uL (ref 0.0–0.1)
Immature Granulocytes: 0 %
Lymphocytes Absolute: 3.8 10*3/uL — ABNORMAL HIGH (ref 0.7–3.1)
Lymphs: 32 %
MCH: 27.2 pg (ref 26.6–33.0)
MCHC: 34.2 g/dL (ref 31.5–35.7)
MCV: 80 fL (ref 79–97)
Monocytes Absolute: 1.1 10*3/uL — ABNORMAL HIGH (ref 0.1–0.9)
Monocytes: 9 %
Neutrophils Absolute: 6.5 10*3/uL (ref 1.4–7.0)
Neutrophils: 57 %
Platelets: 468 10*3/uL — ABNORMAL HIGH (ref 150–450)
RBC: 4.7 x10E6/uL (ref 4.14–5.80)
RDW: 15 % (ref 11.6–15.4)
WBC: 11.6 10*3/uL — ABNORMAL HIGH (ref 3.4–10.8)

## 2018-12-16 ENCOUNTER — Ambulatory Visit (INDEPENDENT_AMBULATORY_CARE_PROVIDER_SITE_OTHER): Payer: Medicaid Other | Admitting: Family Medicine

## 2018-12-16 ENCOUNTER — Other Ambulatory Visit: Payer: Self-pay

## 2018-12-16 DIAGNOSIS — D571 Sickle-cell disease without crisis: Secondary | ICD-10-CM | POA: Diagnosis not present

## 2018-12-16 DIAGNOSIS — I10 Essential (primary) hypertension: Secondary | ICD-10-CM | POA: Diagnosis not present

## 2018-12-16 MED ORDER — AMLODIPINE BESYLATE 5 MG PO TABS: 5.0000 mg | ORAL_TABLET | Freq: Every day | ORAL | 5 refills | Status: DC

## 2018-12-16 MED ORDER — OXYCODONE HCL 5 MG PO TABS: 5.0000 mg | ORAL_TABLET | Freq: Four times a day (QID) | ORAL | 0 refills | Status: DC | PRN

## 2018-12-16 MED ORDER — FOLIC ACID 1 MG PO TABS: 1.0000 mg | ORAL_TABLET | Freq: Every day | ORAL | 11 refills | Status: DC

## 2018-12-16 NOTE — Progress Notes (Signed)
  [name] Sickle Cell Care  Virtual Visit via Telephone Note  I connected with Ronald Hurst on 12/16/18 at 10:00 AM EDT by telephone and verified that I am speaking with the correct person using two identifiers.   I discussed the limitations, risks, security and privacy concerns of performing an evaluation and management service by telephone and the availability of in person appointments. I also discussed with the patient that there may be a patient responsible charge related to this service. The patient expressed understanding and agreed to proceed.   History of Present Illness: Patient states that he is doing well. He endorses compliance with all medications. He denies side effects. No problems today. Needs refills on medications.     Observations/Objective: Patient with normal voice tone, rate and rhythm. No apparent distress.    Assessment and Plan: 1. Essential hypertension No medication changes warranted at the present time.   - amLODipine (NORVASC) 5 MG tablet; Take 1 tablet (5 mg total) by mouth daily.  Dispense: 30 tablet; Refill: 5  2. Hb-SS disease without crisis ([place]) Refilled medications. No medication changes warranted at the present time.   - folic acid (FOLVITE) 1 MG tablet; Take 1 tablet (1 mg total) by mouth daily.  Dispense: 30 tablet; Refill: 11 - oxyCODONE (OXY IR/ROXICODONE) 5 MG immediate release tablet; Take 1 tablet (5 mg total) by mouth every 6 (six) hours as needed for up to 15 days for moderate pain or severe pain.  Dispense: 60 tablet; Refill: 0    Follow Up Instructions: We discussed hand washing, using hand sanitizer when soap and water are not available, only going out when absolutely necessary, and social distancing. Explained to patient that he is immunocompromised and will need to take precautions during this time.     I discussed the assessment and treatment plan with the patient. The patient was provided  an opportunity to ask questions and all were answered. The patient agreed with the plan and demonstrated an understanding of the instructions.   The patient was advised to call back or seek an in-person evaluation if the symptoms worsen or if the condition fails to improve as anticipated.  I provided 15 minutes of non-face-to-face time during this encounter.  [name], FNP-BC [facility] [address] [place], [place] [address] [phone]

## 2019-01-25 ENCOUNTER — Telehealth: Payer: Self-pay

## 2019-01-25 DIAGNOSIS — I10 Essential (primary) hypertension: Secondary | ICD-10-CM

## 2019-01-25 MED ORDER — AMLODIPINE BESYLATE 5 MG PO TABS: 5.0000 mg | ORAL_TABLET | Freq: Every day | ORAL | 5 refills | Status: DC

## 2019-01-25 NOTE — Telephone Encounter (Signed)
Refill for amlodipine sent into pharmacy. Thanks!  

## 2019-01-27 ENCOUNTER — Other Ambulatory Visit: Payer: Self-pay | Admitting: Internal Medicine

## 2019-01-27 DIAGNOSIS — D571 Sickle-cell disease without crisis: Secondary | ICD-10-CM

## 2019-01-27 MED ORDER — OXYCODONE HCL 5 MG PO TABS: 5.0000 mg | ORAL_TABLET | Freq: Four times a day (QID) | ORAL | 0 refills | Status: DC | PRN

## 2019-01-27 NOTE — Telephone Encounter (Signed)
Refilled. Patient is on 5 mg

## 2019-02-15 ENCOUNTER — Telehealth: Payer: Self-pay

## 2019-02-15 NOTE — Telephone Encounter (Signed)
Called and spoke with patient for COVID 19 Screening. Patient had no risk factors and is cleared to come into office for appointment. Thanks! 

## 2019-02-16 ENCOUNTER — Ambulatory Visit (INDEPENDENT_AMBULATORY_CARE_PROVIDER_SITE_OTHER): Payer: Medicaid Other | Admitting: Family Medicine

## 2019-02-16 ENCOUNTER — Other Ambulatory Visit: Payer: Self-pay

## 2019-02-16 ENCOUNTER — Encounter: Payer: Self-pay | Admitting: Family Medicine

## 2019-02-16 VITALS — BP 136/96 | HR 84 | Temp 98.9°F | Ht 69.0 in | Wt 162.0 lb

## 2019-02-16 DIAGNOSIS — D571 Sickle-cell disease without crisis: Secondary | ICD-10-CM | POA: Diagnosis not present

## 2019-02-16 DIAGNOSIS — I10 Essential (primary) hypertension: Secondary | ICD-10-CM | POA: Diagnosis not present

## 2019-02-16 LAB — POCT URINALYSIS DIPSTICK
Bilirubin, UA: NEGATIVE
Glucose, UA: NEGATIVE
Ketones, UA: NEGATIVE
Leukocytes, UA: NEGATIVE
Nitrite, UA: NEGATIVE
Protein, UA: NEGATIVE
Spec Grav, UA: 1.015 (ref 1.010–1.025)
Urobilinogen, UA: 0.2 E.U./dL
pH, UA: 6 (ref 5.0–8.0)

## 2019-02-16 MED ORDER — OXYCODONE HCL 5 MG PO TABS: 5.0000 mg | ORAL_TABLET | Freq: Four times a day (QID) | ORAL | 0 refills | Status: DC | PRN

## 2019-02-16 MED ORDER — IBUPROFEN 600 MG PO TABS: 600.0000 mg | ORAL_TABLET | Freq: Three times a day (TID) | ORAL | 2 refills | Status: DC | PRN

## 2019-02-16 NOTE — Progress Notes (Signed)
PATIENT CARE CENTER INTERNAL MEDICINE AND SICKLE CELL CARE  SICKLE CELL ANEMIA FOLLOW UP VISIT PROVIDER: Lanae Boast, FNP    Subjective:   Ronald Hurst  is a 43 y.o.  male who  has a past medical history of Essential hypertension and Sickle cell anemia ([place]). presents for a follow up for Sickle Cell Anemia. The patient has had 0 admissions in the past 6 months.  Pain regimen includes: Ibuprofen and oxycodone IR 5mg  Hydrea Therapy: No Medication compliance: Yes  Pain today is 3/10.  The patient reports adequate daily hydration.  Patient states that he has had intermittent chest discomfort for the past 3 days. He states that he has not noticed an increase in belching or flatulence, acid reflux or radiation to the left arm. Denies feeling anxious when pain occurs.    Review of Systems  Constitutional: Negative.   HENT: Negative.   Eyes: Negative.   Respiratory: Negative.   Cardiovascular: Positive for chest pain.  Gastrointestinal: Negative.   Genitourinary: Negative.   Musculoskeletal: Negative.   Skin: Negative.   Neurological: Negative.   Psychiatric/Behavioral: Negative.     Objective:   Objective  BP (!) 136/96 (BP Location: Right Arm, Patient Position: Sitting, Cuff Size: Normal)    Pulse 84    Temp 98.9 F (37.2 C) (Oral)    Ht 5\' 9"  (1.753 m)    Wt 162 lb (73.5 kg)    BMI 23.92 kg/m   Wt Readings from Last 3 Encounters:  02/16/19 162 lb (73.5 kg)  10/17/18 169 lb (76.7 kg)  08/08/18 167 lb (75.8 kg)     Physical Exam Vitals signs and nursing note reviewed.  Constitutional:      General: He is not in acute distress.    Appearance: He is well-developed.  HENT:     Head: Normocephalic and atraumatic.  Eyes:     Conjunctiva/sclera: Conjunctivae normal.     Pupils: Pupils are equal, round, and reactive to light.  Neck:     Musculoskeletal: Normal range of motion.  Cardiovascular:     Rate and Rhythm: Normal rate and regular rhythm.     Heart  sounds: Normal heart sounds.  Pulmonary:     Effort: Pulmonary effort is normal. No respiratory distress.     Breath sounds: Normal breath sounds.  Abdominal:     General: Bowel sounds are normal. There is no distension.     Palpations: Abdomen is soft.  Musculoskeletal: Normal range of motion.  Skin:    General: Skin is warm and dry.  Neurological:     Mental Status: He is alert and oriented to person, place, and time.  Psychiatric:        Behavior: Behavior normal.        Thought Content: Thought content normal.      Assessment/Plan:   1. Essential hypertension - Urinalysis Dipstick - EKG 12-Lead - CBC with Differential - Basic Metabolic Panel  2. Hb-SS disease without crisis ([place]) - oxyCODONE (OXY IR/ROXICODONE) 5 MG immediate release tablet; Take 1 tablet (5 mg total) by mouth every 6 (six) hours as needed for up to 15 days for moderate pain or severe pain.  Dispense: 60 tablet; Refill: 0 - ibuprofen (ADVIL) 600 MG tablet; Take 1 tablet (600 mg total) by mouth every 8 (eight) hours as needed for moderate pain.  Dispense: 30 tablet; Refill: 2    Return to care as scheduled and prn. Patient verbalized understanding and agreed with plan of care.  1. Sickle cell disease -  We discussed the need for good hydration, monitoring of hydration status, avoidance of heat, cold, stress, and infection triggers. We discussed the risks and benefits of Hydrea, including bone marrow suppression, the possibility of GI upset, skin ulcers, hair thinning, and teratogenicity. The patient was reminded of the need to seek medical attention of any symptoms of bleeding, anemia, or infection. Continue folic acid 1 mg daily to prevent aplastic bone marrow crises.   2. Pulmonary evaluation - Patient denies severe recurrent wheezes, shortness of breath with exercise, or persistent cough. If these symptoms develop, pulmonary function tests with spirometry will be ordered, and if abnormal, plan on referral  to Pulmonology for further evaluation.  3. Cardiac - Routine screening for pulmonary hypertension is not recommended.  4. Eye - High risk of proliferative retinopathy. Annual eye exam with retinal exam recommended to patient.  5. Immunization status -  Yearly influenza vaccination is recommended, as well as being up to date with Meningococcal and Pneumococcal vaccines.   6. Acute and chronic painful episodes - We discussed that pt is to receive Schedule II prescriptions only from [place]. Pt is also aware that the prescription history is available to [place] online through the [facility] CSRS. Controlled substance agreement signed. We reminded Makel Mcmann that all patients receiving Schedule II narcotics must be seen for follow within one month of prescription being requested. We reviewed the terms of our pain agreement, including the need to keep medicines in a safe locked location away from children or pets, and the need to report excess sedation or constipation, measures to avoid constipation, and policies related to early refills and stolen prescriptions. According to the [place] Chronic Pain Initiative program, we have reviewed details related to analgesia, adverse effects, aberrant behaviors.  7. Iron overload from chronic transfusion.  Not applicable at this time.  If this occurs will use Exjade for management.   8. Vitamin D deficiency - Drisdol 50,000 units weekly. Patient encouraged to take as prescribed.   The above recommendations are taken from the NIH Evidence-Based Management of Sickle Cell Disease: Expert Panel Report, 20149.   [name], FNP-BC [facility] [address] [place], [place] [address] [phone]  This note has been created with Dragon speech recognition software and smart phrase technology. Any transcriptional errors are unintentional.

## 2019-02-17 LAB — CBC WITH DIFFERENTIAL/PLATELET
Basophils Absolute: 0.1 10*3/uL (ref 0.0–0.2)
Basos: 1 %
EOS (ABSOLUTE): 0.2 10*3/uL (ref 0.0–0.4)
Eos: 2 %
Hematocrit: 40.4 % (ref 37.5–51.0)
Hemoglobin: 12.9 g/dL — ABNORMAL LOW (ref 13.0–17.7)
Immature Grans (Abs): 0 10*3/uL (ref 0.0–0.1)
Immature Granulocytes: 0 %
Lymphocytes Absolute: 2.9 10*3/uL (ref 0.7–3.1)
Lymphs: 42 %
MCH: 26.4 pg — ABNORMAL LOW (ref 26.6–33.0)
MCHC: 31.9 g/dL (ref 31.5–35.7)
MCV: 83 fL (ref 79–97)
Monocytes Absolute: 0.7 10*3/uL (ref 0.1–0.9)
Monocytes: 10 %
Neutrophils Absolute: 3.1 10*3/uL (ref 1.4–7.0)
Neutrophils: 45 %
Platelets: 420 10*3/uL (ref 150–450)
RBC: 4.89 x10E6/uL (ref 4.14–5.80)
RDW: 15.9 % — ABNORMAL HIGH (ref 11.6–15.4)
WBC: 6.9 10*3/uL (ref 3.4–10.8)

## 2019-02-17 LAB — BASIC METABOLIC PANEL
BUN/Creatinine Ratio: 8 — ABNORMAL LOW (ref 9–20)
BUN: 11 mg/dL (ref 6–24)
CO2: 22 mmol/L (ref 20–29)
Calcium: 9.5 mg/dL (ref 8.7–10.2)
Chloride: 103 mmol/L (ref 96–106)
Creatinine, Ser: 1.35 mg/dL — ABNORMAL HIGH (ref 0.76–1.27)
GFR calc Af Amer: 74 mL/min/{1.73_m2} (ref >59)
GFR calc non Af Amer: 64 mL/min/{1.73_m2} (ref >59)
Glucose: 93 mg/dL (ref 65–99)
Potassium: 4.5 mmol/L (ref 3.5–5.2)
Sodium: 141 mmol/L (ref 134–144)

## 2019-05-19 ENCOUNTER — Ambulatory Visit (INDEPENDENT_AMBULATORY_CARE_PROVIDER_SITE_OTHER): Payer: Self-pay | Admitting: Family Medicine

## 2019-05-19 ENCOUNTER — Encounter: Payer: Self-pay | Admitting: Family Medicine

## 2019-05-19 ENCOUNTER — Other Ambulatory Visit: Payer: Self-pay

## 2019-05-19 VITALS — BP 142/88 | HR 79 | Temp 98.4°F | Resp 14 | Ht 69.0 in | Wt 159.0 lb

## 2019-05-19 DIAGNOSIS — Z23 Encounter for immunization: Secondary | ICD-10-CM

## 2019-05-19 DIAGNOSIS — D571 Sickle-cell disease without crisis: Secondary | ICD-10-CM

## 2019-05-19 DIAGNOSIS — I10 Essential (primary) hypertension: Secondary | ICD-10-CM

## 2019-05-19 DIAGNOSIS — R42 Dizziness and giddiness: Secondary | ICD-10-CM

## 2019-05-19 LAB — POCT URINALYSIS DIPSTICK
Bilirubin, UA: NEGATIVE
Blood, UA: NEGATIVE
Glucose, UA: NEGATIVE
Ketones, UA: NEGATIVE
Leukocytes, UA: NEGATIVE
Nitrite, UA: NEGATIVE
Protein, UA: NEGATIVE
Spec Grav, UA: 1.015 (ref 1.010–1.025)
Urobilinogen, UA: 0.2 E.U./dL
pH, UA: 6 (ref 5.0–8.0)

## 2019-05-19 MED ORDER — FOLIC ACID 1 MG PO TABS: 1.0000 mg | ORAL_TABLET | Freq: Every day | ORAL | 11 refills | Status: DC

## 2019-05-19 MED ORDER — OXYCODONE HCL 5 MG PO TABS: 5.0000 mg | ORAL_TABLET | Freq: Four times a day (QID) | ORAL | 0 refills | Status: DC | PRN

## 2019-05-19 MED ORDER — AMLODIPINE BESYLATE 5 MG PO TABS: 5.0000 mg | ORAL_TABLET | Freq: Every day | ORAL | 5 refills | Status: DC

## 2019-05-19 MED ORDER — IBUPROFEN 600 MG PO TABS: 600.0000 mg | ORAL_TABLET | Freq: Three times a day (TID) | ORAL | 2 refills | Status: DC | PRN

## 2019-05-19 NOTE — Patient Instructions (Signed)
Sickle Cell Anemia, Adult ° °Sickle cell anemia is a condition where your red blood cells are shaped like sickles. Red blood cells carry oxygen through the body. Sickle-shaped cells do not live as long as normal red blood cells. They also clump together and block blood from flowing through the blood vessels. This prevents the body from getting enough oxygen. Sickle cell anemia causes organ damage and pain. It also increases the risk of infection. °Follow these instructions at home: °Medicines °· Take over-the-counter and prescription medicines only as told by your doctor. °· If you were prescribed an antibiotic medicine, take it as told by your doctor. Do not stop taking the antibiotic even if you start to feel better. °· If you develop a fever, do not take medicines to lower the fever right away. Tell your doctor about the fever. °Managing pain, stiffness, and swelling °· Try these methods to help with pain: °? Use a heating pad. °? Take a warm bath. °? Distract yourself, such as by watching TV. °Eating and drinking °· Drink enough fluid to keep your pee (urine) clear or pale yellow. Drink more in hot weather and during exercise. °· Limit or avoid alcohol. °· Eat a healthy diet. Eat plenty of fruits, vegetables, whole grains, and lean protein. °· Take vitamins and supplements as told by your doctor. °Traveling °· When traveling, keep these with you: °? Your medical information. °? The names of your doctors. °? Your medicines. °· If you need to take an airplane, talk to your doctor first. °Activity °· Rest often. °· Avoid exercises that make your heart beat much faster, such as jogging. °General instructions °· Do not use products that have nicotine or tobacco, such as cigarettes and e-cigarettes. If you need help quitting, ask your doctor. °· Consider wearing a medical alert bracelet. °· Avoid being in high places (high altitudes), such as mountains. °· Avoid very hot or cold temperatures. °· Avoid places where the  temperature changes a lot. °· Keep all follow-up visits as told by your doctor. This is important. °Contact a doctor if: °· A joint hurts. °· Your feet or hands hurt or swell. °· You feel tired (fatigued). °Get help right away if: °· You have symptoms of infection. These include: °? Fever. °? Chills. °? Being very tired. °? Irritability. °? Poor eating. °? Throwing up (vomiting). °· You feel dizzy or faint. °· You have new stomach pain, especially on the left side. °· You have a an erection (priapism) that lasts more than 4 hours. °· You have numbness in your arms or legs. °· You have a hard time moving your arms or legs. °· You have trouble talking. °· You have pain that does not go away when you take medicine. °· You are short of breath. °· You are breathing fast. °· You have a long-term cough. °· You have pain in your chest. °· You have a bad headache. °· You have a stiff neck. °· Your stomach looks bloated even though you did not eat much. °· Your skin is pale. °· You suddenly cannot see well. °Summary °· Sickle cell anemia is a condition where your red blood cells are shaped like sickles. °· Follow your doctor's advice on ways to manage pain, food to eat, activities to do, and steps to take for safe travel. °· Get medical help right away if you have any signs of infection, such as a fever. °This information is not intended to replace advice given to you by   your health care provider. Make sure you discuss any questions you have with your health care provider. °Document Released: 06/28/2013 Document Revised: 12/30/2018 Document Reviewed: 10/13/2016 °Elsevier Patient Education © 2020 Elsevier Inc. ° °

## 2019-05-19 NOTE — Progress Notes (Signed)
[facility]INE AND SICKLE CELL CARE  SICKLE CELL ANEMIA FOLLOW UP VISIT PROVIDER: Lanae Boast, FNP    Subjective:   Ronald Hurst  is a 43 y.o.  male who  has a past medical history of Essential hypertension and Sickle cell anemia ([place]). presents for a follow up for Sickle Cell Anemia. The patient has had 0 admissions in the past 6 months.  Pain regimen includes: Ibuprofen and Oxycodone 5mg  Hydrea Therapy: No Medication compliance: Yes  He reports having an episode of dizziness and fainting. He states that he is concerned about having diabetes. He reports that he had not eaten much that day and was at home with a few friends. Became overheated and was trying to go outside. He does not remember falling, but his friends had to assist him to stand.  Last EKG in May 2020 with NSR. Normal EKG.      Review of Systems  Constitutional: Negative.   HENT: Negative.   Eyes: Negative.   Respiratory: Negative.   Cardiovascular: Negative.   Gastrointestinal: Negative.   Genitourinary: Negative.   Skin: Negative.   Neurological: Positive for dizziness.  Psychiatric/Behavioral: Negative.     Objective:   Objective  BP (!) 142/88 Comment: manually  Pulse 79   Temp 98.4 F (36.9 C) (Oral)   Resp 14   Ht 5\' 9"  (1.753 m)   Wt 159 lb (72.1 kg)   SpO2 100%   BMI 23.48 kg/m   Wt Readings from Last 3 Encounters:  05/19/19 159 lb (72.1 kg)  02/16/19 162 lb (73.5 kg)  10/17/18 169 lb (76.7 kg)     Physical Exam   Assessment/Plan:   Assessment   Encounter Diagnoses  Name Primary?  . Essential hypertension Yes  . Hb-SS disease without crisis ([place])      Plan  1. Essential hypertension Refilled medications. Patient states that he has been out for several weeks.  - Urinalysis Dipstick - amLODipine (NORVASC) 5 MG tablet; Take 1 tablet (5 mg total) by mouth daily.  Dispense: 30 tablet; Refill: 5   2. Hb-SS disease without crisis ([place]) - folic  acid (FOLVITE) 1 MG tablet; Take 1 tablet (1 mg total) by mouth daily.  Dispense: 30 tablet; Refill: 11 - ibuprofen (ADVIL) 600 MG tablet; Take 1 tablet (600 mg total) by mouth every 8 (eight) hours as needed for moderate pain.  Dispense: 30 tablet; Refill: 2 - oxyCODONE (OXY IR/ROXICODONE) 5 MG immediate release tablet; Take 1 tablet (5 mg total) by mouth every 6 (six) hours as needed for up to 15 days for moderate pain or severe pain.  Dispense: 60 tablet; Refill: 0  3. Dizziness - Iron, TIBC and Ferritin Panel - Fructosamine  Return to care as scheduled and prn. Patient verbalized understanding and agreed with plan of care.   1. Sickle cell disease -   We discussed the need for good hydration, monitoring of hydration status, avoidance of heat, cold, stress, and infection triggers. We discussed the risks and benefits of Hydrea, including bone marrow suppression, the possibility of GI upset, skin ulcers, hair thinning, and teratogenicity. The patient was reminded of the need to seek medical attention of any symptoms of bleeding, anemia, or infection. Continue folic acid 1 mg daily to prevent aplastic bone marrow crises.   2. Pulmonary evaluation - Patient denies severe recurrent wheezes, shortness of breath with exercise, or persistent cough. If these symptoms develop, pulmonary function tests with spirometry will be ordered, and if  abnormal, plan on referral to Pulmonology for further evaluation.  3. Cardiac - Routine screening for pulmonary hypertension is not recommended.  4. Eye - High risk of proliferative retinopathy. Annual eye exam with retinal exam recommended to patient.  5. Immunization status -  Yearly influenza vaccination is recommended, as well as being up to date with Meningococcal and Pneumococcal vaccines.   6. Acute and chronic painful episodes - We discussed that pt is to receive Schedule II prescriptions only from [place]. Pt is also aware that the prescription history is  available to [place] online through the [facility] CSRS. Controlled substance agreement signed. We reminded Jyrell Garvey that all patients receiving Schedule II narcotics must be seen for follow within one month of prescription being requested. We reviewed the terms of our pain agreement, including the need to keep medicines in a safe locked location away from children or pets, and the need to report excess sedation or constipation, measures to avoid constipation, and policies related to early refills and stolen prescriptions. According to the [place] [facility] program, we have reviewed details related to analgesia, adverse effects, aberrant behaviors.  7. Iron overload from chronic transfusion.  Not applicable at this time.  If this occurs will use Exjade for management.   8. Vitamin D deficiency - Drisdol 50,000 units weekly. Patient encouraged to take as prescribed.   The above recommendations are taken from the NIH Evidence-Based Management of Sickle Cell Disease: Expert Panel Report, 20149.   [name], FNP-BC [facility] [address] [place],  [address] [phone]  This note has been created with Dragon speech recognition software and smart phrase technology. Any transcriptional errors are unintentional.

## 2019-05-20 LAB — IRON,TIBC AND FERRITIN PANEL
Ferritin: 115 ng/mL (ref 30–400)
Iron Saturation: 32 % (ref 15–55)
Iron: 107 ug/dL (ref 38–169)
Total Iron Binding Capacity: 332 ug/dL (ref 250–450)
UIBC: 225 ug/dL (ref 111–343)

## 2019-05-20 LAB — COMPREHENSIVE METABOLIC PANEL
ALT: 15 IU/L (ref 0–44)
AST: 19 IU/L (ref 0–40)
Albumin/Globulin Ratio: 1.6 (ref 1.2–2.2)
Albumin: 4.5 g/dL (ref 4.0–5.0)
Alkaline Phosphatase: 71 IU/L (ref 39–117)
BUN/Creatinine Ratio: 6 — ABNORMAL LOW (ref 9–20)
BUN: 8 mg/dL (ref 6–24)
Bilirubin Total: 1.2 mg/dL (ref 0.0–1.2)
CO2: 22 mmol/L (ref 20–29)
Calcium: 9.2 mg/dL (ref 8.7–10.2)
Chloride: 102 mmol/L (ref 96–106)
Creatinine, Ser: 1.25 mg/dL (ref 0.76–1.27)
GFR calc Af Amer: 80 mL/min/{1.73_m2} (ref >59)
GFR calc non Af Amer: 70 mL/min/{1.73_m2} (ref >59)
Globulin, Total: 2.8 g/dL (ref 1.5–4.5)
Glucose: 79 mg/dL (ref 65–99)
Potassium: 4 mmol/L (ref 3.5–5.2)
Sodium: 140 mmol/L (ref 134–144)
Total Protein: 7.3 g/dL (ref 6.0–8.5)

## 2019-05-20 LAB — CBC WITH DIFFERENTIAL
Basophils Absolute: 0.1 10*3/uL (ref 0.0–0.2)
Basos: 1 %
EOS (ABSOLUTE): 0.1 10*3/uL (ref 0.0–0.4)
Eos: 1 %
Hematocrit: 38.8 % (ref 37.5–51.0)
Hemoglobin: 13.3 g/dL (ref 13.0–17.7)
Immature Grans (Abs): 0 10*3/uL (ref 0.0–0.1)
Immature Granulocytes: 0 %
Lymphocytes Absolute: 3 10*3/uL (ref 0.7–3.1)
Lymphs: 27 %
MCH: 26.8 pg (ref 26.6–33.0)
MCHC: 34.3 g/dL (ref 31.5–35.7)
MCV: 78 fL — ABNORMAL LOW (ref 79–97)
Monocytes Absolute: 1.2 10*3/uL — ABNORMAL HIGH (ref 0.1–0.9)
Monocytes: 11 %
Neutrophils Absolute: 6.7 10*3/uL (ref 1.4–7.0)
Neutrophils: 60 %
RBC: 4.96 x10E6/uL (ref 4.14–5.80)
RDW: 14.9 % (ref 11.6–15.4)
WBC: 11 10*3/uL — ABNORMAL HIGH (ref 3.4–10.8)

## 2019-05-20 LAB — FRUCTOSAMINE: Fructosamine: 261 umol/L (ref 0–285)

## 2019-05-22 ENCOUNTER — Telehealth: Payer: Self-pay

## 2019-05-22 NOTE — Telephone Encounter (Signed)
Called, no answer. No voicemail.  

## 2019-05-22 NOTE — Progress Notes (Signed)
Your labs are stable. Continue with your current medications. Please remember to keep your follow up appointment. If you have problems, questions or concerns, please make an appointment to discuss. Thanks!

## 2019-05-22 NOTE — Telephone Encounter (Signed)
-----   Message from Lanae Boast, [place] sent at 05/22/2019  2:14 PM EDT ----- Your labs are stable. Continue with your current medications. Please remember to keep your follow up appointment. If you have problems, questions or concerns, please make an appointment to discuss. Thanks!

## 2019-05-24 ENCOUNTER — Encounter: Payer: Self-pay | Admitting: Family Medicine

## 2019-05-31 ENCOUNTER — Encounter (HOSPITAL_COMMUNITY): Payer: Self-pay | Admitting: *Deleted

## 2019-05-31 ENCOUNTER — Encounter (HOSPITAL_COMMUNITY): Payer: Self-pay

## 2019-08-21 ENCOUNTER — Ambulatory Visit (INDEPENDENT_AMBULATORY_CARE_PROVIDER_SITE_OTHER): Payer: Self-pay | Admitting: Family Medicine

## 2019-08-21 ENCOUNTER — Encounter: Payer: Self-pay | Admitting: Family Medicine

## 2019-08-21 ENCOUNTER — Other Ambulatory Visit: Payer: Self-pay

## 2019-08-21 VITALS — BP 134/92 | HR 97 | Temp 97.7°F | Ht 69.0 in | Wt 162.3 lb

## 2019-08-21 DIAGNOSIS — G894 Chronic pain syndrome: Secondary | ICD-10-CM

## 2019-08-21 DIAGNOSIS — Z09 Encounter for follow-up examination after completed treatment for conditions other than malignant neoplasm: Secondary | ICD-10-CM

## 2019-08-21 DIAGNOSIS — G8929 Other chronic pain: Secondary | ICD-10-CM

## 2019-08-21 DIAGNOSIS — D571 Sickle-cell disease without crisis: Secondary | ICD-10-CM | POA: Insufficient documentation

## 2019-08-21 DIAGNOSIS — I10 Essential (primary) hypertension: Secondary | ICD-10-CM

## 2019-08-21 DIAGNOSIS — D5700 Hb-SS disease with crisis, unspecified: Secondary | ICD-10-CM

## 2019-08-21 DIAGNOSIS — F1190 Opioid use, unspecified, uncomplicated: Secondary | ICD-10-CM

## 2019-08-21 DIAGNOSIS — F17200 Nicotine dependence, unspecified, uncomplicated: Secondary | ICD-10-CM

## 2019-08-21 LAB — POCT URINALYSIS DIPSTICK
Bilirubin, UA: NEGATIVE
Glucose, UA: NEGATIVE
Ketones, UA: NEGATIVE
Leukocytes, UA: NEGATIVE
Nitrite, UA: NEGATIVE
Protein, UA: NEGATIVE
Spec Grav, UA: 1.02 (ref 1.010–1.025)
Urobilinogen, UA: 0.2 E.U./dL
pH, UA: 5.5 (ref 5.0–8.0)

## 2019-08-21 MED ORDER — OXYCODONE HCL 5 MG PO TABS: 5.0000 mg | ORAL_TABLET | Freq: Four times a day (QID) | ORAL | 0 refills | Status: DC | PRN

## 2019-08-21 MED ORDER — CLONIDINE HCL 0.1 MG PO TABS
0.1000 mg | ORAL_TABLET | Freq: Once | ORAL | Status: AC
  Administered 2019-08-21: 0.1 mg via ORAL

## 2019-08-21 MED ORDER — IBUPROFEN 600 MG PO TABS: 600.0000 mg | ORAL_TABLET | Freq: Three times a day (TID) | ORAL | 2 refills | Status: DC | PRN

## 2019-08-21 MED ORDER — FOLIC ACID 1 MG PO TABS: 1.0000 mg | ORAL_TABLET | Freq: Every day | ORAL | 11 refills | Status: DC

## 2019-08-21 NOTE — Progress Notes (Signed)
[facility]   Established Patient Office Visit  Subjective:  Patient ID: Ronald Hurst, male    DOB: [birth year]  Age: 43 y.o. MRN: [id number]  CC:  Chief Complaint  Patient presents with  . Follow-up    Former [place] Patient- HTN-Sickle Cell     HPI Ronald Hurst is a 43 year old male who presents for Follow Up today.   Past Medical History:  Diagnosis Date  . Essential hypertension   . Sickle cell anemia (HCC)    Current Status: This will be [name] initial office visit with me. He was previously seeing Lanae Boast, NP for his PCP needs.  Since his last office visit, he is doing well with no complaints. He states that he has pain in his left arm and left extremity. He rates his pain today at 5/10. He has not had a hospital visit for Sickle Cell Crisis since 04/18/2018 where he was treated and discharged 2 days later. He is currently taking all medications as prescribed and staying well hydrated. He reports occasional nausea, constipation, dizziness and headaches. He denies fevers, chills, fatigue, recent infections, weight loss, and night sweats.  No reports of GI problems such as vomiting, and diarrhea.  He has no reports of blood in stools, dysuria and hematuria. No depression or anxiety reported today.   History reviewed. No pertinent surgical history.  Family History  Problem Relation Age of Onset  . Hypertension Sister     Social History   Socioeconomic History  . Marital status: Single    Spouse name: Not on file  . Number of children: Not on file  . Years of education: Not on file  . Highest education level: Not on file  Occupational History  . Not on file  Social Needs  . Financial resource strain: Not on file  . Food insecurity    Worry: Not on file    Inability: Not on file  . Transportation needs    Medical: Not on file    Non-medical: Not on file  Tobacco Use  . Smoking status:  Current Every Day Smoker    Packs/day: 0.50    Types: Cigarettes  . Smokeless tobacco: Never Used  Substance and Sexual Activity  . Alcohol use: Yes  . Drug use: Yes    Types: Marijuana  . Sexual activity: Yes    Birth control/protection: Condom  Lifestyle  . Physical activity    Days per week: Not on file    Minutes per session: Not on file  . Stress: Not on file  Relationships  . Social Herbalist on phone: Not on file    Gets together: Not on file    Attends religious service: Not on file    Active member of club or organization: Not on file    Attends meetings of clubs or organizations: Not on file    Relationship status: Not on file  . Intimate partner violence    Fear of current or ex partner: Not on file    Emotionally abused: Not on file    Physically abused: Not on file    Forced sexual activity: Not on file  Other Topics Concern  . Not on file  Social History Narrative  . Not on file    Outpatient Medications Prior to Visit  Medication Sig Dispense Refill  . amLODipine (NORVASC) 5 MG tablet Take 1 tablet (5 mg total) by mouth daily.  30 tablet 5  . folic acid (FOLVITE) 1 MG tablet Take 1 tablet (1 mg total) by mouth daily. 30 tablet 11  . ibuprofen (ADVIL) 600 MG tablet Take 1 tablet (600 mg total) by mouth every 8 (eight) hours as needed for moderate pain. 30 tablet 2   No facility-administered medications prior to visit.     No Known Allergies  ROS Review of Systems  Constitutional: Negative.   HENT: Negative.   Eyes: Negative.   Respiratory: Positive for shortness of breath.   Cardiovascular: Negative.   Gastrointestinal: Negative.   Endocrine: Negative.   Genitourinary: Negative.   Musculoskeletal: Positive for arthralgias (generalized joint pain).  Skin: Negative.   Allergic/Immunologic: Negative.   Neurological: Positive for dizziness (occasional ) and headaches (occasional ).  Hematological: Negative.   Psychiatric/Behavioral:  Negative.       Objective:    Physical Exam  Constitutional: He is oriented to person, place, and time. He appears well-developed and well-nourished.  HENT:  Head: Normocephalic and atraumatic.  Eyes: Conjunctivae are normal.  Neck: Normal range of motion. Neck supple.  Cardiovascular: Normal rate, regular rhythm, normal heart sounds and intact distal pulses.  Pulmonary/Chest: Effort normal and breath sounds normal.  Abdominal: Soft. Bowel sounds are normal.  Musculoskeletal: Normal range of motion.  Neurological: He is alert and oriented to person, place, and time. He has normal reflexes.  Skin: Skin is warm and dry.  Psychiatric: He has a normal mood and affect. His behavior is normal. Judgment and thought content normal.  Nursing note and vitals reviewed.   BP (!) 134/92   Pulse 97   Temp 97.7 F (36.5 C) (Oral)   Ht 5\' 9"  (1.753 m)   Wt 162 lb 4.8 oz (73.6 kg)   SpO2 98%   BMI 23.97 kg/m  Wt Readings from Last 3 Encounters:  08/21/19 162 lb 4.8 oz (73.6 kg)  05/19/19 159 lb (72.1 kg)  02/16/19 162 lb (73.5 kg)     There are no preventive care reminders to display for this patient.  There are no preventive care reminders to display for this patient.  No results found for: TSH Lab Results  Component Value Date   WBC 11.0 (H) 05/19/2019   HGB 13.3 05/19/2019   HCT 38.8 05/19/2019   MCV 78 (L) 05/19/2019   PLT 420 02/16/2019   Lab Results  Component Value Date   NA 140 05/19/2019   K 4.0 05/19/2019   CO2 22 05/19/2019   GLUCOSE 79 05/19/2019   BUN 8 05/19/2019   CREATININE 1.25 05/19/2019   BILITOT 1.2 05/19/2019   ALKPHOS 71 05/19/2019   AST 19 05/19/2019   ALT 15 05/19/2019   PROT 7.3 05/19/2019   ALBUMIN 4.5 05/19/2019   CALCIUM 9.2 05/19/2019   ANIONGAP 6 04/19/2018   No results found for: CHOL No results found for: HDL No results found for: LDLCALC No results found for: TRIG No results found for: CHOLHDL No results found for: HGBA1C     Assessment & Plan:   1. Essential hypertension Blood pressure is stable. He will continue to take medications as prescribed, to decrease high sodium intake, excessive alcohol intake, increase potassium intake, smoking cessation, and increase physical activity of at least 30 minutes of cardio activity daily. He will continue to follow Heart Healthy or DASH diet. - cloNIDine (CATAPRES) tablet 0.1 mg - Urinalysis Dipstick  2. Hb-SS disease without crisis ([place]) - oxyCODONE (OXY IR/ROXICODONE) 5 MG immediate release tablet; Take  1 tablet (5 mg total) by mouth every 6 (six) hours as needed for up to 15 days for moderate pain or severe pain.  Dispense: 60 tablet; Refill: 0 - Urine Culture - ibuprofen (ADVIL) 600 MG tablet; Take 1 tablet (600 mg total) by mouth every 8 (eight) hours as needed for moderate pain.  Dispense: 30 tablet; Refill: 2 - folic acid (FOLVITE) 1 MG tablet; Take 1 tablet (1 mg total) by mouth daily.  Dispense: 30 tablet; Refill: 11  3. Chronic, continuous use of opioids - oxyCODONE (OXY IR/ROXICODONE) 5 MG immediate release tablet; Take 1 tablet (5 mg total) by mouth every 6 (six) hours as needed for up to 15 days for moderate pain or severe pain.  Dispense: 60 tablet; Refill: 0 - [id number] 11+Oxyco+Alc+Crt-Bund  4. Other chronic pain  5. Chronic pain syndrome - oxyCODONE (OXY IR/ROXICODONE) 5 MG immediate release tablet; Take 1 tablet (5 mg total) by mouth every 6 (six) hours as needed for up to 15 days for moderate pain or severe pain.  Dispense: 60 tablet; Refill: 0 - [id number] 11+Oxyco+Alc+Crt-Bund  6. Tobacco dependence Declines smoking cessation today.  7. Follow up He will follow up in 3 months.   Meds ordered this encounter  Medications  . cloNIDine (CATAPRES) tablet 0.1 mg  . oxyCODONE (OXY IR/ROXICODONE) 5 MG immediate release tablet    Sig: Take 1 tablet (5 mg total) by mouth every 6 (six) hours as needed for up to 15 days for moderate pain or severe pain.     Dispense:  60 tablet    Refill:  0  . ibuprofen (ADVIL) 600 MG tablet    Sig: Take 1 tablet (600 mg total) by mouth every 8 (eight) hours as needed for moderate pain.    Dispense:  30 tablet    Refill:  2  . folic acid (FOLVITE) 1 MG tablet    Sig: Take 1 tablet (1 mg total) by mouth daily.    Dispense:  30 tablet    Refill:  11    Orders Placed This Encounter  Procedures  . Urine Culture  . [id number] 11+Oxyco+Alc+Crt-Bund  . Urinalysis Dipstick    Referral Orders  No referral(s) requested today    Kathe Becton,  MSN, FNP-BC [address] [place], [place] [address] [phone] fax   Problem List Items Addressed This Visit      Cardiovascular and Mediastinum   Essential hypertension     Other   Sickle cell crisis ([place]) - Primary   Relevant Medications   folic acid (FOLVITE) 1 MG tablet   Other Relevant Orders   Urinalysis Dipstick (Completed)   Tobacco dependence    Other Visit Diagnoses    Hb-SS disease without crisis ([place])       Relevant Medications   oxyCODONE (OXY IR/ROXICODONE) 5 MG immediate release tablet   ibuprofen (ADVIL) 0000000 MG tablet   folic acid (FOLVITE) 1 MG tablet   Other Relevant Orders   Urine Culture   Chronic, continuous use of opioids       Relevant Medications   oxyCODONE (OXY IR/ROXICODONE) 5 MG immediate release tablet   Other Relevant Orders   [id number] 11+Oxyco+Alc+Crt-Bund   Other chronic pain       Relevant Medications   oxyCODONE (OXY IR/ROXICODONE) 5 MG immediate release tablet   ibuprofen (ADVIL) 600 MG tablet   Chronic pain syndrome  Relevant Medications   oxyCODONE (OXY IR/ROXICODONE) 5 MG immediate release tablet   ibuprofen (ADVIL) 600 MG tablet   Other Relevant Orders   [id number] 11+Oxyco+Alc+Crt-Bund   Follow up          Meds ordered this encounter  Medications  . cloNIDine (CATAPRES) tablet 0.1 mg  . oxyCODONE (OXY  IR/ROXICODONE) 5 MG immediate release tablet    Sig: Take 1 tablet (5 mg total) by mouth every 6 (six) hours as needed for up to 15 days for moderate pain or severe pain.    Dispense:  60 tablet    Refill:  0  . ibuprofen (ADVIL) 600 MG tablet    Sig: Take 1 tablet (600 mg total) by mouth every 8 (eight) hours as needed for moderate pain.    Dispense:  30 tablet    Refill:  2  . folic acid (FOLVITE) 1 MG tablet    Sig: Take 1 tablet (1 mg total) by mouth daily.    Dispense:  30 tablet    Refill:  11    Follow-up: Return in about 3 months (around 11/19/2019).    Azzie Glatter, FNP

## 2019-08-23 LAB — URINE CULTURE

## 2019-08-24 LAB — DRUG SCREEN 764883 11+OXYCO+ALC+CRT-BUND
Amphetamines, Urine: NEGATIVE ng/mL
BENZODIAZ UR QL: NEGATIVE ng/mL
Barbiturate: NEGATIVE ng/mL
Cocaine (Metabolite): NEGATIVE ng/mL
Creatinine: 174.2 mg/dL (ref 20.0–300.0)
Ethanol: NEGATIVE %
Meperidine: NEGATIVE ng/mL
Methadone Screen, Urine: NEGATIVE ng/mL
OPIATE SCREEN URINE: NEGATIVE ng/mL
Oxycodone/Oxymorphone, Urine: NEGATIVE ng/mL
Phencyclidine: NEGATIVE ng/mL
Propoxyphene: NEGATIVE ng/mL
Tramadol: NEGATIVE ng/mL
pH, Urine: 5.6 (ref 4.5–8.9)

## 2019-08-24 LAB — CANNABINOID CONFIRMATION, UR
CANNABINOIDS: POSITIVE — AB
Carboxy THC GC/MS Conf: 83 ng/mL

## 2019-10-17 ENCOUNTER — Other Ambulatory Visit: Payer: Self-pay

## 2019-10-17 ENCOUNTER — Emergency Department (HOSPITAL_BASED_OUTPATIENT_CLINIC_OR_DEPARTMENT_OTHER)
Admission: EM | Admit: 2019-10-17 | Discharge: 2019-10-17 | Disposition: A | Discharge status: Home / Self Care | Payer: Self-pay | Attending: Emergency Medicine | Admitting: Emergency Medicine

## 2019-10-17 ENCOUNTER — Emergency Department (HOSPITAL_BASED_OUTPATIENT_CLINIC_OR_DEPARTMENT_OTHER): Payer: Self-pay

## 2019-10-17 ENCOUNTER — Encounter (HOSPITAL_BASED_OUTPATIENT_CLINIC_OR_DEPARTMENT_OTHER): Payer: Self-pay | Admitting: *Deleted

## 2019-10-17 DIAGNOSIS — S2232XA Fracture of one rib, left side, initial encounter for closed fracture: Secondary | ICD-10-CM | POA: Insufficient documentation

## 2019-10-17 DIAGNOSIS — M545 Low back pain, unspecified: Secondary | ICD-10-CM

## 2019-10-17 DIAGNOSIS — F17210 Nicotine dependence, cigarettes, uncomplicated: Secondary | ICD-10-CM | POA: Insufficient documentation

## 2019-10-17 DIAGNOSIS — Z79899 Other long term (current) drug therapy: Secondary | ICD-10-CM | POA: Insufficient documentation

## 2019-10-17 DIAGNOSIS — R0789 Other chest pain: Secondary | ICD-10-CM

## 2019-10-17 DIAGNOSIS — M542 Cervicalgia: Secondary | ICD-10-CM | POA: Insufficient documentation

## 2019-10-17 DIAGNOSIS — Y9389 Activity, other specified: Secondary | ICD-10-CM | POA: Insufficient documentation

## 2019-10-17 DIAGNOSIS — Y92410 Unspecified street and highway as the place of occurrence of the external cause: Secondary | ICD-10-CM | POA: Insufficient documentation

## 2019-10-17 DIAGNOSIS — Y999 Unspecified external cause status: Secondary | ICD-10-CM | POA: Insufficient documentation

## 2019-10-17 DIAGNOSIS — I10 Essential (primary) hypertension: Secondary | ICD-10-CM | POA: Insufficient documentation

## 2019-10-17 DIAGNOSIS — R0781 Pleurodynia: Secondary | ICD-10-CM

## 2019-10-17 HISTORY — DX: Other chest pain: R07.89

## 2019-10-17 HISTORY — DX: Pleurodynia: R07.81

## 2019-10-17 MED ORDER — IBUPROFEN 600 MG PO TABS: 600.0000 mg | ORAL_TABLET | Freq: Four times a day (QID) | ORAL | 0 refills | Status: DC | PRN

## 2019-10-17 MED ORDER — HYDROCODONE-ACETAMINOPHEN 5-325 MG PO TABS: 1.0000 | ORAL_TABLET | Freq: Four times a day (QID) | ORAL | 0 refills | Status: DC | PRN

## 2019-10-17 MED ORDER — CYCLOBENZAPRINE HCL 10 MG PO TABS: 10.0000 mg | ORAL_TABLET | Freq: Two times a day (BID) | ORAL | 0 refills | Status: DC | PRN

## 2019-10-17 MED ORDER — HYDROCODONE-ACETAMINOPHEN 5-325 MG PO TABS
1.0000 | ORAL_TABLET | Freq: Once | ORAL | Status: AC
  Administered 2019-10-17: 1 via ORAL
  Filled 2019-10-17: qty 1

## 2019-10-17 MED ORDER — CYCLOBENZAPRINE HCL 10 MG PO TABS
10.0000 mg | ORAL_TABLET | Freq: Once | ORAL | Status: AC
  Administered 2019-10-17: 10 mg via ORAL
  Filled 2019-10-17: qty 1

## 2019-10-17 MED FILL — HYDROCODON-APAP 5-325: 5-325 | 3 days supply | Qty: 10 | Fill #0

## 2019-10-17 MED FILL — IBUPROFEN 600 MG TABLET: 600 | 8 days supply | Qty: 30 | Fill #0

## 2019-10-17 MED FILL — CYCLOBENZAPRINE HCL 10 MG T: 10 | 5 days supply | Qty: 10 | Fill #0
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
## 2019-10-17 NOTE — ED Notes (Signed)
IS and education provided to pt.
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
## 2019-10-17 NOTE — Discharge Instructions (Signed)
Use the incentive spirometer at 3 times daily for the next 2 weeks.  Alternate 600 mg of ibuprofen and [phone] mg of Tylenol every 3 hours as needed for pain. Do not exceed 4000 mg of Tylenol daily.  Take ibuprofen with food to avoid upset stomach issues.  You can take hydrocodone as needed for severe pain but do not drive, drink alcohol or operate heavy machinery while taking this medicine as it can cause drowsiness.  Be aware this medicine also contains Tylenol.  You can take Flexeril up to 2 times daily as needed for muscle relaxation but be aware this medicine also can cause sedation so the same restrictions apply.  I typically recommend only taking this at night.  During plenty of fluids and get plenty of rest.  Apply heat to areas of pain and do some gentle stretching.  Avoid periods of prolonged sitting as this can prolong muscle stiffness.  You may want to purchase a device called a pulse oximeter which she can purchase at a pharmacy or online which can tell you your oxygen levels.  Anything below 90% is low and requires immediate evaluation  Follow-up with your primary care provider for reevaluation of symptoms  Return to the emergency department if you develop any concerning signs or symptoms such as shortness of breath, fevers, cough, low oxygen levels or severe chest pains.

## 2019-10-20 ENCOUNTER — Encounter: Payer: Self-pay | Admitting: Family Medicine

## 2019-10-23 DIAGNOSIS — E559 Vitamin D deficiency, unspecified: Secondary | ICD-10-CM

## 2019-10-23 HISTORY — DX: Vitamin D deficiency, unspecified: E55.9

## 2019-10-31 ENCOUNTER — Other Ambulatory Visit: Payer: Self-pay

## 2019-10-31 ENCOUNTER — Encounter: Payer: Self-pay | Admitting: Family Medicine

## 2019-10-31 ENCOUNTER — Ambulatory Visit (INDEPENDENT_AMBULATORY_CARE_PROVIDER_SITE_OTHER): Payer: Self-pay | Admitting: Family Medicine

## 2019-10-31 VITALS — BP 132/92 | HR 85 | Temp 98.5°F | Ht 69.0 in | Wt 165.6 lb

## 2019-10-31 DIAGNOSIS — R0781 Pleurodynia: Secondary | ICD-10-CM | POA: Insufficient documentation

## 2019-10-31 DIAGNOSIS — G894 Chronic pain syndrome: Secondary | ICD-10-CM

## 2019-10-31 DIAGNOSIS — M545 Low back pain, unspecified: Secondary | ICD-10-CM | POA: Insufficient documentation

## 2019-10-31 DIAGNOSIS — I10 Essential (primary) hypertension: Secondary | ICD-10-CM

## 2019-10-31 DIAGNOSIS — F17200 Nicotine dependence, unspecified, uncomplicated: Secondary | ICD-10-CM

## 2019-10-31 DIAGNOSIS — Z09 Encounter for follow-up examination after completed treatment for conditions other than malignant neoplasm: Secondary | ICD-10-CM

## 2019-10-31 DIAGNOSIS — R0789 Other chest pain: Secondary | ICD-10-CM | POA: Insufficient documentation

## 2019-10-31 DIAGNOSIS — D571 Sickle-cell disease without crisis: Secondary | ICD-10-CM

## 2019-10-31 DIAGNOSIS — F1190 Opioid use, unspecified, uncomplicated: Secondary | ICD-10-CM

## 2019-10-31 LAB — POCT URINALYSIS DIPSTICK
Bilirubin, UA: NEGATIVE
Glucose, UA: NEGATIVE
Ketones, UA: NEGATIVE
Leukocytes, UA: NEGATIVE
Nitrite, UA: NEGATIVE
Protein, UA: POSITIVE — AB
Spec Grav, UA: 1.02 (ref 1.010–1.025)
Urobilinogen, UA: 0.2 E.U./dL
pH, UA: 7 (ref 5.0–8.0)

## 2019-10-31 MED ORDER — CLONIDINE HCL 0.1 MG PO TABS
0.1000 mg | ORAL_TABLET | Freq: Once | ORAL | Status: AC
  Administered 2019-10-31: 11:00:00 0.1 mg via ORAL

## 2019-10-31 MED ORDER — OXYCODONE HCL 5 MG PO TABS: 5.0000 mg | ORAL_TABLET | Freq: Four times a day (QID) | ORAL | 0 refills | Status: DC | PRN
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
## 2019-11-01 LAB — CMP14+CBC/D/PLT+FER+RETIC+V...
ALT: 15 IU/L (ref 0–44)
AST: 21 IU/L (ref 0–40)
Albumin/Globulin Ratio: 1.8 (ref 1.2–2.2)
Albumin: 4.3 g/dL (ref 4.0–5.0)
Alkaline Phosphatase: 74 IU/L (ref 39–117)
BUN/Creatinine Ratio: 7 — ABNORMAL LOW (ref 9–20)
BUN: 9 mg/dL (ref 6–24)
Basophils Absolute: 0.1 10*3/uL (ref 0.0–0.2)
Basos: 1 %
Bilirubin Total: 1 mg/dL (ref 0.0–1.2)
CO2: 23 mmol/L (ref 20–29)
Calcium: 9.1 mg/dL (ref 8.7–10.2)
Chloride: 105 mmol/L (ref 96–106)
Creatinine, Ser: 1.26 mg/dL (ref 0.76–1.27)
EOS (ABSOLUTE): 0.2 10*3/uL (ref 0.0–0.4)
Eos: 2 %
Ferritin: 78 ng/mL (ref 30–400)
GFR calc Af Amer: 80 mL/min/{1.73_m2} (ref >59)
GFR calc non Af Amer: 69 mL/min/{1.73_m2} (ref >59)
Globulin, Total: 2.4 g/dL (ref 1.5–4.5)
Glucose: 92 mg/dL (ref 65–99)
Hematocrit: 36 % — ABNORMAL LOW (ref 37.5–51.0)
Hemoglobin: 11.9 g/dL — ABNORMAL LOW (ref 13.0–17.7)
Immature Grans (Abs): 0 10*3/uL (ref 0.0–0.1)
Immature Granulocytes: 0 %
Lymphocytes Absolute: 3.4 10*3/uL — ABNORMAL HIGH (ref 0.7–3.1)
Lymphs: 37 %
MCH: 27 pg (ref 26.6–33.0)
MCHC: 33.1 g/dL (ref 31.5–35.7)
MCV: 82 fL (ref 79–97)
Monocytes Absolute: 1 10*3/uL — ABNORMAL HIGH (ref 0.1–0.9)
Monocytes: 11 %
Neutrophils Absolute: 4.4 10*3/uL (ref 1.4–7.0)
Neutrophils: 49 %
Platelets: 456 10*3/uL — ABNORMAL HIGH (ref 150–450)
Potassium: 4 mmol/L (ref 3.5–5.2)
RBC: 4.4 x10E6/uL (ref 4.14–5.80)
RDW: 15.5 % — ABNORMAL HIGH (ref 11.6–15.4)
Retic Ct Pct: 2.5 % (ref 0.6–2.6)
Sodium: 142 mmol/L (ref 134–144)
Total Protein: 6.7 g/dL (ref 6.0–8.5)
Vit D, 25-Hydroxy: 10 ng/mL — ABNORMAL LOW (ref 30.0–100.0)
WBC: 9 10*3/uL (ref 3.4–10.8)

## 2019-11-01 LAB — VITAMIN B12: Vitamin B-12: 693 pg/mL (ref 232–1245)

## 2019-11-05 ENCOUNTER — Encounter: Payer: Self-pay | Admitting: Family Medicine

## 2019-11-05 ENCOUNTER — Other Ambulatory Visit: Payer: Self-pay | Admitting: Family Medicine

## 2019-11-05 DIAGNOSIS — E559 Vitamin D deficiency, unspecified: Secondary | ICD-10-CM

## 2019-11-05 MED ORDER — VITAMIN D (ERGOCALCIFEROL) 1.25 MG (50000 UNIT) PO CAPS: 50000.0000 [IU] | ORAL_CAPSULE | ORAL | 6 refills | Status: DC

## 2019-11-20 ENCOUNTER — Ambulatory Visit: Payer: Medicaid Other | Admitting: Family Medicine

## 2019-12-20 ENCOUNTER — Telehealth: Payer: Self-pay

## 2019-12-20 NOTE — Telephone Encounter (Signed)
Ronald [facility] can you please advise on patient's request for pain medication? Last saw you on 10/31/2019.
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
## 2019-12-21 ENCOUNTER — Other Ambulatory Visit: Payer: Self-pay | Admitting: Family Medicine

## 2019-12-21 DIAGNOSIS — D571 Sickle-cell disease without crisis: Secondary | ICD-10-CM

## 2019-12-21 DIAGNOSIS — F1190 Opioid use, unspecified, uncomplicated: Secondary | ICD-10-CM

## 2019-12-21 DIAGNOSIS — G894 Chronic pain syndrome: Secondary | ICD-10-CM

## 2019-12-21 MED ORDER — OXYCODONE HCL 5 MG PO TABS: 5.0000 mg | ORAL_TABLET | Freq: Four times a day (QID) | ORAL | 0 refills | Status: DC | PRN

## 2019-12-29 ENCOUNTER — Encounter: Payer: Self-pay | Admitting: Nurse Practitioner

## 2019-12-29 ENCOUNTER — Ambulatory Visit (INDEPENDENT_AMBULATORY_CARE_PROVIDER_SITE_OTHER): Payer: Self-pay | Admitting: Nurse Practitioner

## 2019-12-29 ENCOUNTER — Other Ambulatory Visit: Payer: Self-pay

## 2019-12-29 VITALS — BP 122/97 | HR 87 | Temp 98.6°F | Resp 14 | Ht 69.0 in | Wt 161.0 lb

## 2019-12-29 DIAGNOSIS — F17209 Nicotine dependence, unspecified, with unspecified nicotine-induced disorders: Secondary | ICD-10-CM

## 2019-12-29 DIAGNOSIS — H539 Unspecified visual disturbance: Secondary | ICD-10-CM

## 2019-12-29 DIAGNOSIS — R200 Anesthesia of skin: Secondary | ICD-10-CM

## 2019-12-29 DIAGNOSIS — D571 Sickle-cell disease without crisis: Secondary | ICD-10-CM

## 2019-12-29 DIAGNOSIS — I10 Essential (primary) hypertension: Secondary | ICD-10-CM

## 2019-12-29 DIAGNOSIS — R202 Paresthesia of skin: Secondary | ICD-10-CM

## 2019-12-29 LAB — POCT URINALYSIS DIPSTICK
Bilirubin, UA: NEGATIVE
Glucose, UA: NEGATIVE
Ketones, UA: NEGATIVE
Leukocytes, UA: NEGATIVE
Nitrite, UA: NEGATIVE
Protein, UA: NEGATIVE
Spec Grav, UA: 1.02 (ref 1.010–1.025)
Urobilinogen, UA: 0.2 E.U./dL
pH, UA: 5.5 (ref 5.0–8.0)

## 2019-12-29 MED ORDER — IBUPROFEN 600 MG PO TABS: 600.0000 mg | ORAL_TABLET | Freq: Four times a day (QID) | ORAL | 0 refills | Status: DC | PRN

## 2019-12-29 NOTE — Progress Notes (Signed)
Integrated Behavioral Health Case Management Referral Note  12/29/2019 Name: Ronald Hurst MRN: [id number] DOB: 11-Nov-1975 Ronald Hurst is a 44 y.o. year old male who sees No primary care provider on file. for primary care. LCSW was consulted to assess patient's access to community resources and assist the patient with Financial Difficulties related to low income..  Assessment: Patient experiencing Financial constraints related to low income and inadequate insurance coverage. Patient in need of an eye exam. Has family planning Medicaid which will not cover eye exams. Completed [facility] [place] application. Will submit once patient provides necessary supporting income documents. CSW provided contact information and advised patient to call with any additional needs; patient did not report additional needs today.  Review of patient status, including review of consultants reports, relevant laboratory and other test results, and collaboration with appropriate care team members and the patient's provider was performed as part of comprehensive patient evaluation and provision of services.    SDOH (Social Determinants of Health) assessments performed: No    Outpatient Encounter Medications as of 12/29/2019  Medication Sig Note  . amLODipine (NORVASC) 5 MG tablet Take 1 tablet (5 mg total) by mouth daily.   . folic acid (FOLVITE) 1 MG tablet Take 1 tablet (1 mg total) by mouth daily.   Marland Kitchen ibuprofen (ADVIL) 600 MG tablet Take 1 tablet (600 mg total) by mouth every 6 (six) hours as needed.   Marland Kitchen oxyCODONE (OXY IR/ROXICODONE) 5 MG immediate release tablet Take 1 tablet (5 mg total) by mouth every 6 (six) hours as needed for up to 15 days for moderate pain or severe pain.   . Vitamin D, Ergocalciferol, (DRISDOL) 1.25 MG (50000 UNIT) CAPS capsule Take 1 capsule (50,000 Units total) by mouth every 7 (seven) days.   . [DISCONTINUED] ibuprofen (ADVIL) 600 MG tablet Take 1 tablet (600 mg total) by mouth every 6  (six) hours as needed. 10/31/2019: As needed.   . cyclobenzaprine (FLEXERIL) 10 MG tablet Take 1 tablet (10 mg total) by mouth 2 (two) times daily as needed for muscle spasms. (Patient not taking: Reported on 12/29/2019) 10/31/2019: As needed  . [DISCONTINUED] HYDROcodone-acetaminophen (NORCO/VICODIN) 5-325 MG tablet Take 1 tablet by mouth every 6 (six) hours as needed for severe pain. (Patient not taking: Reported on 12/29/2019) 10/31/2019: As needed.    No facility-administered encounter medications on file as of 12/29/2019.    Goals Addressed   None      Follow up Plan: 1. Patient will provide necessary income verification documents to support [facility] [place] application and then CSW will submit.  Estanislado Emms, [facility] [phone]
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
## 2019-12-30 LAB — CMP14+CBC/D/PLT+FER+RETIC+V...
ALT: 13 IU/L (ref 0–44)
AST: 21 IU/L (ref 0–40)
Albumin/Globulin Ratio: 1.8 (ref 1.2–2.2)
Albumin: 4.4 g/dL (ref 4.0–5.0)
Alkaline Phosphatase: 77 IU/L (ref 39–117)
BUN/Creatinine Ratio: 8 — ABNORMAL LOW (ref 9–20)
BUN: 9 mg/dL (ref 6–24)
Basophils Absolute: 0.1 10*3/uL (ref 0.0–0.2)
Basos: 1 %
Bilirubin Total: 0.9 mg/dL (ref 0.0–1.2)
CO2: 23 mmol/L (ref 20–29)
Calcium: 9.8 mg/dL (ref 8.7–10.2)
Chloride: 103 mmol/L (ref 96–106)
Creatinine, Ser: 1.15 mg/dL (ref 0.76–1.27)
EOS (ABSOLUTE): 0.5 10*3/uL — ABNORMAL HIGH (ref 0.0–0.4)
Eos: 5 %
Ferritin: 114 ng/mL (ref 30–400)
GFR calc Af Amer: 89 mL/min/{1.73_m2} (ref >59)
GFR calc non Af Amer: 77 mL/min/{1.73_m2} (ref >59)
Globulin, Total: 2.5 g/dL (ref 1.5–4.5)
Glucose: 86 mg/dL (ref 65–99)
Hematocrit: 37.6 % (ref 37.5–51.0)
Hemoglobin: 12.7 g/dL — ABNORMAL LOW (ref 13.0–17.7)
Immature Grans (Abs): 0 10*3/uL (ref 0.0–0.1)
Immature Granulocytes: 0 %
Lymphocytes Absolute: 3.7 10*3/uL — ABNORMAL HIGH (ref 0.7–3.1)
Lymphs: 37 %
MCH: 27.3 pg (ref 26.6–33.0)
MCHC: 33.8 g/dL (ref 31.5–35.7)
MCV: 81 fL (ref 79–97)
Monocytes Absolute: 1 10*3/uL — ABNORMAL HIGH (ref 0.1–0.9)
Monocytes: 10 %
NRBC: 1 % — ABNORMAL HIGH (ref 0–0)
Neutrophils Absolute: 4.8 10*3/uL (ref 1.4–7.0)
Neutrophils: 47 %
Platelets: 451 10*3/uL — ABNORMAL HIGH (ref 150–450)
Potassium: 4.4 mmol/L (ref 3.5–5.2)
RBC: 4.66 x10E6/uL (ref 4.14–5.80)
RDW: 15.4 % (ref 11.6–15.4)
Retic Ct Pct: 2.7 % — ABNORMAL HIGH (ref 0.6–2.6)
Sodium: 140 mmol/L (ref 134–144)
Total Protein: 6.9 g/dL (ref 6.0–8.5)
Vit D, 25-Hydroxy: 31.4 ng/mL (ref 30.0–100.0)
WBC: 10.2 10*3/uL (ref 3.4–10.8)

## 2019-12-30 LAB — VITAMIN B12: Vitamin B-12: 747 pg/mL (ref 232–1245)

## 2019-12-30 LAB — MAGNESIUM: Magnesium: 2.2 mg/dL (ref 1.6–2.3)

## 2019-12-30 LAB — SEDIMENTATION RATE: Sed Rate: 2 mm/hr (ref 0–15)

## 2020-01-03 ENCOUNTER — Other Ambulatory Visit: Payer: Self-pay | Admitting: Nurse Practitioner

## 2020-01-03 ENCOUNTER — Ambulatory Visit (HOSPITAL_BASED_OUTPATIENT_CLINIC_OR_DEPARTMENT_OTHER)
Admission: RE | Admit: 2020-01-03 | Discharge: 2020-01-03 | Disposition: A | Discharge status: Home / Self Care | Payer: Self-pay | Source: Ambulatory Visit | Attending: Nurse Practitioner | Admitting: Nurse Practitioner

## 2020-01-03 ENCOUNTER — Encounter (HOSPITAL_COMMUNITY): Payer: Self-pay

## 2020-01-03 ENCOUNTER — Other Ambulatory Visit: Payer: Self-pay

## 2020-01-03 ENCOUNTER — Ambulatory Visit (HOSPITAL_COMMUNITY)
Admission: RE | Admit: 2020-01-03 | Discharge: 2020-01-03 | Disposition: A | Discharge status: Home / Self Care | Payer: Self-pay | Source: Ambulatory Visit | Attending: Nurse Practitioner | Admitting: Nurse Practitioner

## 2020-01-03 DIAGNOSIS — R200 Anesthesia of skin: Secondary | ICD-10-CM

## 2020-01-03 DIAGNOSIS — R202 Paresthesia of skin: Secondary | ICD-10-CM

## 2020-01-03 NOTE — Progress Notes (Signed)
Left upper extremity venous duplex has been completed. Preliminary results can be found in CV Proc through chart review.  Results were given to Dionisio David NP.  01/03/20 2:34 PM Carlos Levering RVT

## 2020-01-04 LAB — OXYCODONE/OXYMORPHONE, CONFIRM
OXYCODONE/OXYMORPH: POSITIVE — AB
OXYCODONE: 1111 ng/mL
OXYCODONE: POSITIVE — AB
OXYMORPHONE (GC/MS): 1479 ng/mL
OXYMORPHONE: POSITIVE — AB

## 2020-01-04 LAB — DRUG SCREEN 764883 11+OXYCO+ALC+CRT-BUND
Amphetamines, Urine: NEGATIVE ng/mL
BENZODIAZ UR QL: NEGATIVE ng/mL
Barbiturate: NEGATIVE ng/mL
Cocaine (Metabolite): NEGATIVE ng/mL
Creatinine: 141.2 mg/dL (ref 20.0–300.0)
Ethanol: NEGATIVE %
Meperidine: NEGATIVE ng/mL
Methadone Screen, Urine: NEGATIVE ng/mL
OPIATE SCREEN URINE: NEGATIVE ng/mL
Phencyclidine: NEGATIVE ng/mL
Propoxyphene: NEGATIVE ng/mL
Tramadol: NEGATIVE ng/mL
pH, Urine: 5.5 (ref 4.5–8.9)

## 2020-01-04 LAB — CANNABINOID CONFIRMATION, UR
CANNABINOIDS: POSITIVE — AB
Carboxy THC GC/MS Conf: 99 ng/mL

## 2020-03-29 ENCOUNTER — Encounter: Payer: Self-pay | Admitting: Nurse Practitioner

## 2020-03-29 ENCOUNTER — Ambulatory Visit (INDEPENDENT_AMBULATORY_CARE_PROVIDER_SITE_OTHER): Payer: Self-pay | Admitting: Nurse Practitioner

## 2020-03-29 ENCOUNTER — Other Ambulatory Visit: Payer: Self-pay

## 2020-03-29 VITALS — BP 145/107 | HR 66 | Temp 98.2°F | Resp 20 | Ht 69.0 in | Wt 163.0 lb

## 2020-03-29 DIAGNOSIS — I10 Essential (primary) hypertension: Secondary | ICD-10-CM

## 2020-03-29 DIAGNOSIS — F1190 Opioid use, unspecified, uncomplicated: Secondary | ICD-10-CM

## 2020-03-29 DIAGNOSIS — G894 Chronic pain syndrome: Secondary | ICD-10-CM

## 2020-03-29 DIAGNOSIS — R8290 Unspecified abnormal findings in urine: Secondary | ICD-10-CM

## 2020-03-29 DIAGNOSIS — E559 Vitamin D deficiency, unspecified: Secondary | ICD-10-CM

## 2020-03-29 DIAGNOSIS — D571 Sickle-cell disease without crisis: Secondary | ICD-10-CM

## 2020-03-29 LAB — POCT URINALYSIS DIP (CLINITEK)
Bilirubin, UA: NEGATIVE
Glucose, UA: NEGATIVE mg/dL
Ketones, POC UA: NEGATIVE mg/dL
Nitrite, UA: NEGATIVE
POC PROTEIN,UA: NEGATIVE
Spec Grav, UA: 1.015 (ref 1.010–1.025)
Urobilinogen, UA: 0.2 E.U./dL
pH, UA: 5.5 (ref 5.0–8.0)

## 2020-03-29 MED ORDER — CYCLOBENZAPRINE HCL 10 MG PO TABS: 10.0000 mg | ORAL_TABLET | Freq: Two times a day (BID) | ORAL | 0 refills | Status: AC | PRN

## 2020-03-29 MED ORDER — CLONIDINE HCL 0.1 MG PO TABS: 0.2000 mg | ORAL_TABLET | Freq: Once | ORAL | Status: DC

## 2020-03-29 MED ORDER — VITAMIN D (ERGOCALCIFEROL) 1.25 MG (50000 UNIT) PO CAPS: 50000.0000 [IU] | ORAL_CAPSULE | ORAL | 6 refills | Status: DC

## 2020-03-29 MED ORDER — IBUPROFEN 600 MG PO TABS: 600.0000 mg | ORAL_TABLET | Freq: Four times a day (QID) | ORAL | 0 refills | Status: DC | PRN

## 2020-03-29 MED ORDER — FOLIC ACID 1 MG PO TABS: 1.0000 mg | ORAL_TABLET | Freq: Every day | ORAL | 11 refills | Status: DC

## 2020-03-29 MED ORDER — OXYCODONE HCL 5 MG PO TABS: 5.0000 mg | ORAL_TABLET | Freq: Four times a day (QID) | ORAL | 0 refills | Status: DC | PRN

## 2020-03-29 MED ORDER — CYCLOBENZAPRINE HCL 10 MG PO TABS: 10.0000 mg | ORAL_TABLET | Freq: Two times a day (BID) | ORAL | 0 refills | Status: DC | PRN

## 2020-03-29 MED ORDER — AMLODIPINE BESYLATE 10 MG PO TABS: 10.0000 mg | ORAL_TABLET | Freq: Every day | ORAL | 3 refills | Status: DC

## 2020-03-29 NOTE — Patient Instructions (Signed)
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
## 2020-03-30 LAB — CMP14+CBC/D/PLT+FER+RETIC+V...
ALT: 16 IU/L (ref 0–44)
AST: 20 IU/L (ref 0–40)
Albumin/Globulin Ratio: 1.8 (ref 1.2–2.2)
Albumin: 4.5 g/dL (ref 4.0–5.0)
Alkaline Phosphatase: 72 IU/L (ref 48–121)
BUN/Creatinine Ratio: 6 — ABNORMAL LOW (ref 9–20)
BUN: 7 mg/dL (ref 6–24)
Basophils Absolute: 0.1 10*3/uL (ref 0.0–0.2)
Basos: 1 %
Bilirubin Total: 1 mg/dL (ref 0.0–1.2)
CO2: 24 mmol/L (ref 20–29)
Calcium: 9.6 mg/dL (ref 8.7–10.2)
Chloride: 104 mmol/L (ref 96–106)
Creatinine, Ser: 1.2 mg/dL (ref 0.76–1.27)
EOS (ABSOLUTE): 0.3 10*3/uL (ref 0.0–0.4)
Eos: 3 %
Ferritin: 74 ng/mL (ref 30–400)
GFR calc Af Amer: 85 mL/min/{1.73_m2} (ref >59)
GFR calc non Af Amer: 73 mL/min/{1.73_m2} (ref >59)
Globulin, Total: 2.5 g/dL (ref 1.5–4.5)
Glucose: 80 mg/dL (ref 65–99)
Hematocrit: 40.8 % (ref 37.5–51.0)
Hemoglobin: 13.4 g/dL (ref 13.0–17.7)
Immature Grans (Abs): 0 10*3/uL (ref 0.0–0.1)
Immature Granulocytes: 0 %
Lymphocytes Absolute: 2.7 10*3/uL (ref 0.7–3.1)
Lymphs: 31 %
MCH: 27.3 pg (ref 26.6–33.0)
MCHC: 32.8 g/dL (ref 31.5–35.7)
MCV: 83 fL (ref 79–97)
Monocytes Absolute: 0.9 10*3/uL (ref 0.1–0.9)
Monocytes: 11 %
Neutrophils Absolute: 4.6 10*3/uL (ref 1.4–7.0)
Neutrophils: 54 %
Platelets: 384 10*3/uL (ref 150–450)
Potassium: 4.2 mmol/L (ref 3.5–5.2)
RBC: 4.91 x10E6/uL (ref 4.14–5.80)
RDW: 15.9 % — ABNORMAL HIGH (ref 11.6–15.4)
Retic Ct Pct: 3 % — ABNORMAL HIGH (ref 0.6–2.6)
Sodium: 142 mmol/L (ref 134–144)
Total Protein: 7 g/dL (ref 6.0–8.5)
Vit D, 25-Hydroxy: 20.5 ng/mL — ABNORMAL LOW (ref 30.0–100.0)
WBC: 8.6 10*3/uL (ref 3.4–10.8)

## 2020-03-31 LAB — URINE CULTURE: Organism ID, Bacteria: NO GROWTH

## 2020-04-05 LAB — CANNABINOID CONFIRMATION, UR
CANNABINOIDS: POSITIVE — AB
Carboxy THC GC/MS Conf: 80 ng/mL

## 2020-04-05 LAB — DRUG SCREEN 764883 11+OXYCO+ALC+CRT-BUND
Amphetamines, Urine: NEGATIVE ng/mL
BENZODIAZ UR QL: NEGATIVE ng/mL
Barbiturate: NEGATIVE ng/mL
Cocaine (Metabolite): NEGATIVE ng/mL
Creatinine: 110 mg/dL (ref 20.0–300.0)
Ethanol: NEGATIVE %
Meperidine: NEGATIVE ng/mL
Methadone Screen, Urine: NEGATIVE ng/mL
OPIATE SCREEN URINE: NEGATIVE ng/mL
Oxycodone/Oxymorphone, Urine: NEGATIVE ng/mL
Phencyclidine: NEGATIVE ng/mL
Propoxyphene: NEGATIVE ng/mL
Tramadol: NEGATIVE ng/mL
pH, Urine: 6.2 (ref 4.5–8.9)

## 2020-06-28 ENCOUNTER — Encounter: Payer: Self-pay | Admitting: Nurse Practitioner

## 2020-06-28 ENCOUNTER — Ambulatory Visit (INDEPENDENT_AMBULATORY_CARE_PROVIDER_SITE_OTHER): Payer: Self-pay | Admitting: Nurse Practitioner

## 2020-06-28 ENCOUNTER — Other Ambulatory Visit: Payer: Self-pay

## 2020-06-28 VITALS — BP 114/89 | HR 92 | Temp 98.4°F | Ht 69.0 in | Wt 158.0 lb

## 2020-06-28 DIAGNOSIS — E559 Vitamin D deficiency, unspecified: Secondary | ICD-10-CM

## 2020-06-28 DIAGNOSIS — D571 Sickle-cell disease without crisis: Secondary | ICD-10-CM

## 2020-06-28 DIAGNOSIS — G894 Chronic pain syndrome: Secondary | ICD-10-CM

## 2020-06-28 LAB — POCT URINALYSIS DIP (CLINITEK)
Bilirubin, UA: NEGATIVE
Glucose, UA: NEGATIVE mg/dL
Ketones, POC UA: NEGATIVE mg/dL
Leukocytes, UA: NEGATIVE
Nitrite, UA: NEGATIVE
POC PROTEIN,UA: NEGATIVE
Spec Grav, UA: 1.025 (ref 1.010–1.025)
Urobilinogen, UA: NEGATIVE E.U./dL — AB
pH, UA: 5.5 (ref 5.0–8.0)

## 2020-06-28 MED ORDER — VITAMIN D (ERGOCALCIFEROL) 1.25 MG (50000 UNIT) PO CAPS: 50000.0000 [IU] | ORAL_CAPSULE | ORAL | 0 refills | Status: DC

## 2020-06-28 MED ORDER — IBUPROFEN 600 MG PO TABS: 600.0000 mg | ORAL_TABLET | Freq: Four times a day (QID) | ORAL | 3 refills | Status: DC | PRN

## 2020-06-28 MED ORDER — OXYCODONE-ACETAMINOPHEN 5-325 MG PO TABS: 1.0000 | ORAL_TABLET | ORAL | 0 refills | Status: DC | PRN
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
## 2020-07-29 ENCOUNTER — Encounter: Payer: Medicaid Other | Admitting: Nurse Practitioner

## 2020-08-01 ENCOUNTER — Other Ambulatory Visit: Payer: Self-pay

## 2020-08-01 ENCOUNTER — Encounter: Payer: Self-pay | Admitting: Nurse Practitioner

## 2020-08-01 ENCOUNTER — Ambulatory Visit (INDEPENDENT_AMBULATORY_CARE_PROVIDER_SITE_OTHER): Payer: Self-pay | Admitting: Nurse Practitioner

## 2020-08-01 DIAGNOSIS — D571 Sickle-cell disease without crisis: Secondary | ICD-10-CM

## 2020-08-01 DIAGNOSIS — G894 Chronic pain syndrome: Secondary | ICD-10-CM

## 2020-08-01 DIAGNOSIS — F1190 Opioid use, unspecified, uncomplicated: Secondary | ICD-10-CM

## 2020-08-01 MED ORDER — OXYCODONE HCL 5 MG PO TABS: 5.0000 mg | ORAL_TABLET | Freq: Four times a day (QID) | ORAL | 0 refills | Status: DC | PRN
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
## 2020-08-05 ENCOUNTER — Encounter: Payer: Self-pay | Admitting: Nurse Practitioner

## 2020-09-02 ENCOUNTER — Ambulatory Visit (INDEPENDENT_AMBULATORY_CARE_PROVIDER_SITE_OTHER): Payer: Self-pay | Admitting: Nurse Practitioner

## 2020-09-02 ENCOUNTER — Other Ambulatory Visit: Payer: Self-pay

## 2020-09-02 ENCOUNTER — Encounter: Payer: Self-pay | Admitting: Nurse Practitioner

## 2020-09-02 VITALS — BP 148/96 | HR 72 | Temp 98.1°F | Resp 18 | Ht 69.0 in | Wt 164.2 lb

## 2020-09-02 DIAGNOSIS — I10 Essential (primary) hypertension: Secondary | ICD-10-CM

## 2020-09-02 DIAGNOSIS — Z125 Encounter for screening for malignant neoplasm of prostate: Secondary | ICD-10-CM

## 2020-09-02 DIAGNOSIS — F17209 Nicotine dependence, unspecified, with unspecified nicotine-induced disorders: Secondary | ICD-10-CM

## 2020-09-02 DIAGNOSIS — D571 Sickle-cell disease without crisis: Secondary | ICD-10-CM

## 2020-09-02 DIAGNOSIS — E559 Vitamin D deficiency, unspecified: Secondary | ICD-10-CM

## 2020-09-02 DIAGNOSIS — G894 Chronic pain syndrome: Secondary | ICD-10-CM

## 2020-09-02 DIAGNOSIS — Z1159 Encounter for screening for other viral diseases: Secondary | ICD-10-CM

## 2020-09-02 DIAGNOSIS — F1190 Opioid use, unspecified, uncomplicated: Secondary | ICD-10-CM

## 2020-09-02 DIAGNOSIS — Z113 Encounter for screening for infections with a predominantly sexual mode of transmission: Secondary | ICD-10-CM

## 2020-09-02 LAB — POCT URINALYSIS DIP (CLINITEK)
Bilirubin, UA: NEGATIVE
Glucose, UA: NEGATIVE mg/dL
Ketones, POC UA: NEGATIVE mg/dL
Leukocytes, UA: NEGATIVE
Nitrite, UA: NEGATIVE
POC PROTEIN,UA: NEGATIVE
Spec Grav, UA: 1.02 (ref 1.010–1.025)
Urobilinogen, UA: 0.2 E.U./dL
pH, UA: 6.5 (ref 5.0–8.0)

## 2020-09-02 MED ORDER — OXYCODONE HCL 5 MG PO CAPS: 5.0000 mg | ORAL_CAPSULE | Freq: Four times a day (QID) | ORAL | 0 refills | Status: DC | PRN

## 2020-09-02 MED ORDER — VITAMIN D (ERGOCALCIFEROL) 1.25 MG (50000 UNIT) PO CAPS: 50000.0000 [IU] | ORAL_CAPSULE | ORAL | 0 refills | Status: DC

## 2020-09-02 MED ORDER — IBUPROFEN 600 MG PO TABS: 600.0000 mg | ORAL_TABLET | Freq: Four times a day (QID) | ORAL | 3 refills | Status: DC | PRN

## 2020-09-02 MED ORDER — DOXYCYCLINE HYCLATE 100 MG PO CAPS: 100.0000 mg | ORAL_CAPSULE | Freq: Two times a day (BID) | ORAL | 0 refills | Status: DC

## 2020-09-02 MED ORDER — CLONIDINE HCL 0.1 MG PO TABS: 0.2000 mg | ORAL_TABLET | Freq: Once | ORAL | Status: DC

## 2020-09-02 MED ORDER — CEFTRIAXONE SODIUM 250 MG IJ SOLR
500.0000 mg | Freq: Once | INTRAMUSCULAR | Status: AC
  Administered 2020-09-02: 10:00:00 500 mg via INTRAMUSCULAR

## 2020-09-02 MED ORDER — DOXYCYCLINE MONOHYDRATE 100 MG PO TABS: 100.0000 mg | ORAL_TABLET | Freq: Two times a day (BID) | ORAL | 0 refills | Status: AC
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
## 2020-09-02 NOTE — Patient Instructions (Addendum)
[facility] Address: [address] [place], [place] [address] Phone: [phone]   [facility] [address] [place], [place]  [address] Phone:  [phone]   Fax:  [phone]    Health Maintenance, Male Adopting a healthy lifestyle and getting preventive care are important in promoting health and wellness. Ask your health care provider about:  The right schedule for you to have regular tests and exams.  Things you can do on your own to prevent diseases and keep yourself healthy. What should I know about diet, weight, and exercise? Eat a healthy diet   Eat a diet that includes plenty of vegetables, fruits, low-fat dairy products, and lean protein.  Do not eat a lot of foods that are high in solid fats, added sugars, or sodium. Maintain a healthy weight Body mass index (BMI) is a measurement that can be used to identify possible weight problems. It estimates body fat based on height and weight. Your health care provider can help determine your BMI and help you achieve or maintain a healthy weight. Get regular exercise Get regular exercise. This is one of the most important things you can do for your health. Most adults should:  Exercise for at least 150 minutes each week. The exercise should increase your heart rate and make you sweat (moderate-intensity exercise).  Do strengthening exercises at least twice a week. This is in addition to the moderate-intensity exercise.  Spend less time sitting. Even light physical activity can be beneficial. Watch cholesterol and blood lipids Have your blood tested for lipids and cholesterol at 45 years of age, then have this test every 5 years. You may need to have your cholesterol levels checked more often if:  Your lipid or cholesterol levels are high.  You are older than 45 years of age.  You are at high risk for heart disease. What should I know about cancer screening? Many types of  cancers can be detected early and may often be prevented. Depending on your health history and family history, you may need to have cancer screening at various ages. This may include screening for:  Colorectal cancer.  Prostate cancer.  Skin cancer.  Lung cancer. What should I know about heart disease, diabetes, and high blood pressure? Blood pressure and heart disease  High blood pressure causes heart disease and increases the risk of stroke. This is more likely to develop in people who have high blood pressure readings, are of African descent, or are overweight.  Talk with your health care provider about your target blood pressure readings.  Have your blood pressure checked: ? Every 3-5 years if you are 77-55 years of age. ? Every year if you are 64 years old or older.  If you are between the ages of 55 and 15 and are a current or former smoker, ask your health care provider if you should have a one-time screening for abdominal aortic aneurysm (AAA). Diabetes Have regular diabetes screenings. This checks your fasting blood sugar level. Have the screening done:  Once every three years after age 66 if you are at a normal weight and have a low risk for diabetes.  More often and at a younger age if you are overweight or have a high risk for diabetes. What should I know about preventing infection? Hepatitis B If you have a higher risk for hepatitis B, you should be screened for this virus. Talk with your health care provider to find out  if you are at risk for hepatitis B infection. Hepatitis C Blood testing is recommended for:  Everyone born from [birth year] through 1966.  Anyone with known risk factors for hepatitis C. Sexually transmitted infections (STIs)  You should be screened each year for STIs, including gonorrhea and chlamydia, if: ? You are sexually active and are younger than 45 years of age. ? You are older than 45 years of age and your health care provider tells you that you  are at risk for this type of infection. ? Your sexual activity has changed since you were last screened, and you are at increased risk for chlamydia or gonorrhea. Ask your health care provider if you are at risk.  Ask your health care provider about whether you are at high risk for HIV. Your health care provider may recommend a prescription medicine to help prevent HIV infection. If you choose to take medicine to prevent HIV, you should first get tested for HIV. You should then be tested every 3 months for as long as you are taking the medicine. Follow these instructions at home: Lifestyle  Do not use any products that contain nicotine or tobacco, such as cigarettes, e-cigarettes, and chewing tobacco. If you need help quitting, ask your health care provider.  Do not use street drugs.  Do not share needles.  Ask your health care provider for help if you need support or information about quitting drugs. Alcohol use  Do not drink alcohol if your health care provider tells you not to drink.  If you drink alcohol: ? Limit how much you have to 0-2 drinks a day. ? Be aware of how much alcohol is in your drink. In the [place] one drink equals one 12 oz bottle of beer (355 mL), one 5 oz glass of wine (148 mL), or one 1 oz glass of hard liquor (44 mL). General instructions  Schedule regular health, dental, and eye exams.  Stay current with your vaccines.  Tell your health care provider if: ? You often feel depressed. ? You have ever been abused or do not feel safe at home. Summary  Adopting a healthy lifestyle and getting preventive care are important in promoting health and wellness.  Follow your health care provider's instructions about healthy diet, exercising, and getting tested or screened for diseases.  Follow your health care provider's instructions on monitoring your cholesterol and blood pressure. This information is not intended to replace advice given to you by your health care  provider. Make sure you discuss any questions you have with your health care provider. Document Revised: 08/31/2018 Document Reviewed: 08/31/2018 Elsevier Patient Education  2020 [employer].   Steps to Quit Smoking Smoking tobacco is the leading cause of preventable death. It can affect almost every organ in the body. Smoking puts you and people around you at risk for many serious, long-lasting (chronic) diseases. Quitting smoking can be hard, but it is one of the best things that you can do for your health. It is never too late to quit. How do I get ready to quit? When you decide to quit smoking, make a plan to help you succeed. Before you quit:  Pick a date to quit. Set a date within the next 2 weeks to give you time to prepare.  Write down the reasons why you are quitting. Keep this list in places where you will see it often.  Tell your family, friends, and co-workers that you are quitting. Their support is important.  Talk  with your doctor about the choices that may help you quit.  Find out if your health insurance will pay for these treatments.  Know the people, places, things, and activities that make you want to smoke (triggers). Avoid them. What first steps can I take to quit smoking?  Throw away all cigarettes at home, at work, and in your car.  Throw away the things that you use when you smoke, such as ashtrays and lighters.  Clean your car. Make sure to empty the ashtray.  Clean your home, including curtains and carpets. What can I do to help me quit smoking? Talk with your doctor about taking medicines and seeing a counselor at the same time. You are more likely to succeed when you do both.  If you are pregnant or breastfeeding, talk with your doctor about counseling or other ways to quit smoking. Do not take medicine to help you quit smoking unless your doctor tells you to do so. To quit smoking: Quit right away  Quit smoking totally, instead of slowly cutting  back on how much you smoke over a period of time.  Go to counseling. You are more likely to quit if you go to counseling sessions regularly. Take medicine You may take medicines to help you quit. Some medicines need a prescription, and some you can buy over-the-counter. Some medicines may contain a drug called nicotine to replace the nicotine in cigarettes. Medicines may:  Help you to stop having the desire to smoke (cravings).  Help to stop the problems that come when you stop smoking (withdrawal symptoms). Your doctor may ask you to use:  Nicotine patches, gum, or lozenges.  Nicotine inhalers or sprays.  Non-nicotine medicine that is taken by mouth. Find resources Find resources and other ways to help you quit smoking and remain smoke-free after you quit. These resources are most helpful when you use them often. They include:  Online chats with a Social worker.  Phone quitlines.  Printed Furniture conservator/restorer.  Support groups or group counseling.  Text messaging programs.  Mobile phone apps. Use apps on your mobile phone or tablet that can help you stick to your quit plan. There are many free apps for mobile phones and tablets as well as websites. Examples include Quit Guide from the [employer] and smokefree.gov  What things can I do to make it easier to quit?   Talk to your family and friends. Ask them to support and encourage you.  Call a phone quitline (1-800-QUIT-NOW), reach out to support groups, or work with a Social worker.  Ask people who smoke to not smoke around you.  Avoid places that make you want to smoke, such as: ? Bars. ? Parties. ? Smoke-break areas at work.  Spend time with people who do not smoke.  Lower the stress in your life. Stress can make you want to smoke. Try these things to help your stress: ? Getting regular exercise. ? Doing deep-breathing exercises. ? Doing yoga. ? Meditating. ? Doing a body scan. To do this, close your eyes, focus on one area of  your body at a time from head to toe. Notice which parts of your body are tense. Try to relax the muscles in those areas. How will I feel when I quit smoking? Day 1 to 3 weeks Within the first 24 hours, you may start to have some problems that come from quitting tobacco. These problems are very bad 2-3 days after you quit, but they do not often last for more  than 2-3 weeks. You may get these symptoms:  Mood swings.  Feeling restless, nervous, angry, or annoyed.  Trouble concentrating.  Dizziness.  Strong desire for high-sugar foods and nicotine.  Weight gain.  Trouble pooping (constipation).  Feeling like you may vomit (nausea).  Coughing or a sore throat.  Changes in how the medicines that you take for other issues work in your body.  Depression.  Trouble sleeping (insomnia). Week 3 and afterward After the first 2-3 weeks of quitting, you may start to notice more positive results, such as:  Better sense of smell and taste.  Less coughing and sore throat.  Slower heart rate.  Lower blood pressure.  Clearer skin.  Better breathing.  Fewer sick days. Quitting smoking can be hard. Do not give up if you fail the first time. Some people need to try a few times before they succeed. Do your best to stick to your quit plan, and talk with your doctor if you have any questions or concerns. Summary  Smoking tobacco is the leading cause of preventable death. Quitting smoking can be hard, but it is one of the best things that you can do for your health.  When you decide to quit smoking, make a plan to help you succeed.  Quit smoking right away, not slowly over a period of time.  When you start quitting, seek help from your doctor, family, or friends. This information is not intended to replace advice given to you by your health care provider. Make sure you discuss any questions you have with your health care provider. Document Revised: 06/02/2019 Document Reviewed:  11/26/2018 Elsevier Patient Education  [place] Maintenance, Male Adopting a healthy lifestyle and getting preventive care are important in promoting health and wellness. Ask your health care provider about:  The right schedule for you to have regular tests and exams.  Things you can do on your own to prevent diseases and keep yourself healthy. What should I know about diet, weight, and exercise? Eat a healthy diet   Eat a diet that includes plenty of vegetables, fruits, low-fat dairy products, and lean protein.  Do not eat a lot of foods that are high in solid fats, added sugars, or sodium. Maintain a healthy weight Body mass index (BMI) is a measurement that can be used to identify possible weight problems. It estimates body fat based on height and weight. Your health care provider can help determine your BMI and help you achieve or maintain a healthy weight. Get regular exercise Get regular exercise. This is one of the most important things you can do for your health. Most adults should:  Exercise for at least 150 minutes each week. The exercise should increase your heart rate and make you sweat (moderate-intensity exercise).  Do strengthening exercises at least twice a week. This is in addition to the moderate-intensity exercise.  Spend less time sitting. Even light physical activity can be beneficial. Watch cholesterol and blood lipids Have your blood tested for lipids and cholesterol at 45 years of age, then have this test every 5 years. You may need to have your cholesterol levels checked more often if:  Your lipid or cholesterol levels are high.  You are older than 45 years of age.  You are at high risk for heart disease. What should I know about cancer screening? Many types of cancers can be detected early and may often be prevented. Depending on your health history and family history, you may  need to have cancer screening at various ages. This may  include screening for:  Colorectal cancer.  Prostate cancer.  Skin cancer.  Lung cancer. What should I know about heart disease, diabetes, and high blood pressure? Blood pressure and heart disease  High blood pressure causes heart disease and increases the risk of stroke. This is more likely to develop in people who have high blood pressure readings, are of African descent, or are overweight.  Talk with your health care provider about your target blood pressure readings.  Have your blood pressure checked: ? Every 3-5 years if you are 43-55 years of age. ? Every year if you are 72 years old or older.  If you are between the ages of 36 and 68 and are a current or former smoker, ask your health care provider if you should have a one-time screening for abdominal aortic aneurysm (AAA). Diabetes Have regular diabetes screenings. This checks your fasting blood sugar level. Have the screening done:  Once every three years after age 11 if you are at a normal weight and have a low risk for diabetes.  More often and at a younger age if you are overweight or have a high risk for diabetes. What should I know about preventing infection? Hepatitis B If you have a higher risk for hepatitis B, you should be screened for this virus. Talk with your health care provider to find out if you are at risk for hepatitis B infection. Hepatitis C Blood testing is recommended for:  Everyone born from [birth year] through 1966.  Anyone with known risk factors for hepatitis C. Sexually transmitted infections (STIs)  You should be screened each year for STIs, including gonorrhea and chlamydia, if: ? You are sexually active and are younger than 45 years of age. ? You are older than 45 years of age and your health care provider tells you that you are at risk for this type of infection. ? Your sexual activity has changed since you were last screened, and you are at increased risk for chlamydia or gonorrhea. Ask your  health care provider if you are at risk.  Ask your health care provider about whether you are at high risk for HIV. Your health care provider may recommend a prescription medicine to help prevent HIV infection. If you choose to take medicine to prevent HIV, you should first get tested for HIV. You should then be tested every 3 months for as long as you are taking the medicine. Follow these instructions at home: Lifestyle  Do not use any products that contain nicotine or tobacco, such as cigarettes, e-cigarettes, and chewing tobacco. If you need help quitting, ask your health care provider.  Do not use street drugs.  Do not share needles.  Ask your health care provider for help if you need support or information about quitting drugs. Alcohol use  Do not drink alcohol if your health care provider tells you not to drink.  If you drink alcohol: ? Limit how much you have to 0-2 drinks a day. ? Be aware of how much alcohol is in your drink. In the [place] one drink equals one 12 oz bottle of beer (355 mL), one 5 oz glass of wine (148 mL), or one 1 oz glass of hard liquor (44 mL). General instructions  Schedule regular health, dental, and eye exams.  Stay current with your vaccines.  Tell your health care provider if: ? You often feel depressed. ? You have ever been abused or  do not feel safe at home. Summary  Adopting a healthy lifestyle and getting preventive care are important in promoting health and wellness.  Follow your health care provider's instructions about healthy diet, exercising, and getting tested or screened for diseases.  Follow your health care provider's instructions on monitoring your cholesterol and blood pressure. This information is not intended to replace advice given to you by your health care provider. Make sure you discuss any questions you have with your health care provider. Document Revised: 08/31/2018 Document Reviewed: 08/31/2018 Elsevier Patient Education   2020 [employer].

## 2020-09-03 ENCOUNTER — Other Ambulatory Visit: Payer: Self-pay | Admitting: Nurse Practitioner

## 2020-09-03 LAB — STD SCREEN (8)
HIV Screen 4th Generation wRfx: NONREACTIVE
HSV 1 Glycoprotein G Ab, IgG: 42.7 index — ABNORMAL HIGH (ref 0.00–0.90)
HSV 2 IgG, Type Spec: 10.8 index — ABNORMAL HIGH (ref 0.00–0.90)
Hep A IgM: NEGATIVE
Hep B C IgM: NEGATIVE
Hep C Virus Ab: <0.1 s/co ratio (ref 0.0–0.9)
Hepatitis B Surface Ag: NEGATIVE
RPR Ser Ql: NONREACTIVE

## 2020-09-03 LAB — CMP14+CBC/D/PLT+FER+RETIC+V...
ALT: 14 IU/L (ref 0–44)
AST: 24 IU/L (ref 0–40)
Albumin/Globulin Ratio: 1.4 (ref 1.2–2.2)
Albumin: 4.1 g/dL (ref 4.0–5.0)
Alkaline Phosphatase: 71 IU/L (ref 44–121)
BUN/Creatinine Ratio: 7 — ABNORMAL LOW (ref 9–20)
BUN: 10 mg/dL (ref 6–24)
Basophils Absolute: 0.1 10*3/uL (ref 0.0–0.2)
Basos: 1 %
Bilirubin Total: 0.9 mg/dL (ref 0.0–1.2)
CO2: 23 mmol/L (ref 20–29)
Calcium: 9.3 mg/dL (ref 8.7–10.2)
Chloride: 103 mmol/L (ref 96–106)
Creatinine, Ser: 1.46 mg/dL — ABNORMAL HIGH (ref 0.76–1.27)
EOS (ABSOLUTE): 0.2 10*3/uL (ref 0.0–0.4)
Eos: 3 %
Ferritin: 45 ng/mL (ref 30–400)
GFR calc Af Amer: 66 mL/min/{1.73_m2} (ref >59)
GFR calc non Af Amer: 57 mL/min/{1.73_m2} — ABNORMAL LOW (ref >59)
Globulin, Total: 2.9 g/dL (ref 1.5–4.5)
Glucose: 93 mg/dL (ref 65–99)
Hematocrit: 37 % — ABNORMAL LOW (ref 37.5–51.0)
Hemoglobin: 12.8 g/dL — ABNORMAL LOW (ref 13.0–17.7)
Immature Grans (Abs): 0 10*3/uL (ref 0.0–0.1)
Immature Granulocytes: 0 %
Lymphocytes Absolute: 2.7 10*3/uL (ref 0.7–3.1)
Lymphs: 31 %
MCH: 28.3 pg (ref 26.6–33.0)
MCHC: 34.6 g/dL (ref 31.5–35.7)
MCV: 82 fL (ref 79–97)
Monocytes Absolute: 0.9 10*3/uL (ref 0.1–0.9)
Monocytes: 10 %
Neutrophils Absolute: 4.8 10*3/uL (ref 1.4–7.0)
Neutrophils: 55 %
Platelets: 480 10*3/uL — ABNORMAL HIGH (ref 150–450)
Potassium: 3.9 mmol/L (ref 3.5–5.2)
RBC: 4.52 x10E6/uL (ref 4.14–5.80)
RDW: 15.6 % — ABNORMAL HIGH (ref 11.6–15.4)
Retic Ct Pct: 2.6 % (ref 0.6–2.6)
Sodium: 140 mmol/L (ref 134–144)
Total Protein: 7 g/dL (ref 6.0–8.5)
Vit D, 25-Hydroxy: 46.4 ng/mL (ref 30.0–100.0)
WBC: 8.7 10*3/uL (ref 3.4–10.8)

## 2020-09-03 LAB — PSA: Prostate Specific Ag, Serum: 0.7 ng/mL (ref 0.0–4.0)

## 2020-09-03 MED ORDER — OXYCODONE HCL 5 MG PO TABS: 5.0000 mg | ORAL_TABLET | Freq: Four times a day (QID) | ORAL | 0 refills | Status: DC | PRN

## 2020-09-04 ENCOUNTER — Other Ambulatory Visit: Payer: Self-pay | Admitting: Nurse Practitioner

## 2020-09-06 ENCOUNTER — Other Ambulatory Visit: Payer: Self-pay

## 2020-09-06 LAB — CHLAMYDIA/GONOCOCCUS/TRICHOMONAS, NAA
Chlamydia by NAA: POSITIVE — AB
Gonococcus by NAA: NEGATIVE
Trich vag by NAA: NEGATIVE

## 2020-09-06 NOTE — Telephone Encounter (Signed)
Is this okay to refill? 

## 2020-09-17 LAB — DRUG SCREEN 764883 11+OXYCO+ALC+CRT-BUND
Amphetamines, Urine: NEGATIVE ng/mL
BENZODIAZ UR QL: NEGATIVE ng/mL
Barbiturate: NEGATIVE ng/mL
Cocaine (Metabolite): NEGATIVE ng/mL
Creatinine: 125.9 mg/dL (ref 20.0–300.0)
Ethanol: NEGATIVE %
Meperidine: NEGATIVE ng/mL
Methadone Screen, Urine: NEGATIVE ng/mL
OPIATE SCREEN URINE: NEGATIVE ng/mL
Oxycodone/Oxymorphone, Urine: NEGATIVE ng/mL
Phencyclidine: NEGATIVE ng/mL
Propoxyphene: NEGATIVE ng/mL
Tramadol: NEGATIVE ng/mL
pH, Urine: 6.1 (ref 4.5–8.9)

## 2020-09-17 LAB — CANNABINOID CONFIRMATION, UR
CANNABINOIDS: POSITIVE — AB
Carboxy THC GC/MS Conf: 54 ng/mL

## 2020-10-13 ENCOUNTER — Encounter (HOSPITAL_BASED_OUTPATIENT_CLINIC_OR_DEPARTMENT_OTHER): Payer: Self-pay | Admitting: Emergency Medicine

## 2020-10-13 ENCOUNTER — Other Ambulatory Visit: Payer: Self-pay

## 2020-10-13 ENCOUNTER — Inpatient Hospital Stay (HOSPITAL_BASED_OUTPATIENT_CLINIC_OR_DEPARTMENT_OTHER)
Admission: EM | Admit: 2020-10-13 | Discharge: 2020-10-20 | DRG: 812 | Disposition: A | Discharge status: Home / Self Care | Payer: Self-pay | Attending: Internal Medicine | Admitting: Internal Medicine

## 2020-10-13 DIAGNOSIS — I10 Essential (primary) hypertension: Secondary | ICD-10-CM | POA: Diagnosis present

## 2020-10-13 DIAGNOSIS — E860 Dehydration: Secondary | ICD-10-CM | POA: Diagnosis present

## 2020-10-13 DIAGNOSIS — I700 Atherosclerosis of aorta: Secondary | ICD-10-CM | POA: Diagnosis present

## 2020-10-13 DIAGNOSIS — F17200 Nicotine dependence, unspecified, uncomplicated: Secondary | ICD-10-CM | POA: Diagnosis present

## 2020-10-13 DIAGNOSIS — D72829 Elevated white blood cell count, unspecified: Secondary | ICD-10-CM | POA: Diagnosis present

## 2020-10-13 DIAGNOSIS — J9811 Atelectasis: Secondary | ICD-10-CM | POA: Diagnosis present

## 2020-10-13 DIAGNOSIS — F17210 Nicotine dependence, cigarettes, uncomplicated: Secondary | ICD-10-CM | POA: Diagnosis present

## 2020-10-13 DIAGNOSIS — Z8616 Personal history of COVID-19: Secondary | ICD-10-CM

## 2020-10-13 DIAGNOSIS — Z20822 Contact with and (suspected) exposure to covid-19: Secondary | ICD-10-CM | POA: Diagnosis present

## 2020-10-13 DIAGNOSIS — Z8249 Family history of ischemic heart disease and other diseases of the circulatory system: Secondary | ICD-10-CM

## 2020-10-13 DIAGNOSIS — D1803 Hemangioma of intra-abdominal structures: Secondary | ICD-10-CM | POA: Diagnosis present

## 2020-10-13 DIAGNOSIS — R9389 Abnormal findings on diagnostic imaging of other specified body structures: Secondary | ICD-10-CM

## 2020-10-13 DIAGNOSIS — N179 Acute kidney failure, unspecified: Secondary | ICD-10-CM | POA: Diagnosis present

## 2020-10-13 DIAGNOSIS — E559 Vitamin D deficiency, unspecified: Secondary | ICD-10-CM | POA: Diagnosis present

## 2020-10-13 DIAGNOSIS — Z79899 Other long term (current) drug therapy: Secondary | ICD-10-CM

## 2020-10-13 DIAGNOSIS — C787 Secondary malignant neoplasm of liver and intrahepatic bile duct: Secondary | ICD-10-CM

## 2020-10-13 DIAGNOSIS — D5700 Hb-SS disease with crisis, unspecified: Principal | ICD-10-CM | POA: Diagnosis present

## 2020-10-13 NOTE — ED Notes (Signed)
Patient states positive covid test 2 weeks ago at [facility].

## 2020-10-13 NOTE — ED Triage Notes (Signed)
Patient presents with complaints of lower back pain and bilateral thigh pain onset today; states took tylenol and ibuprofen pta with no relief; states thinks this is a sickle cell crisis.

## 2020-10-14 ENCOUNTER — Emergency Department (HOSPITAL_BASED_OUTPATIENT_CLINIC_OR_DEPARTMENT_OTHER): Payer: Self-pay

## 2020-10-14 DIAGNOSIS — D5700 Hb-SS disease with crisis, unspecified: Secondary | ICD-10-CM | POA: Diagnosis present

## 2020-10-14 DIAGNOSIS — C787 Secondary malignant neoplasm of liver and intrahepatic bile duct: Secondary | ICD-10-CM | POA: Insufficient documentation

## 2020-10-14 LAB — RETICULOCYTES
Immature Retic Fract: 28 % — ABNORMAL HIGH (ref 2.3–15.9)
RBC.: 3.96 MIL/uL — ABNORMAL LOW (ref 4.22–5.81)
Retic Count, Absolute: 83.6 10*3/uL (ref 19.0–186.0)
Retic Ct Pct: 2.1 % (ref 0.4–3.1)

## 2020-10-14 LAB — SARS CORONAVIRUS 2 (TAT 6-24 HRS): SARS Coronavirus 2: NEGATIVE

## 2020-10-14 LAB — CBC WITH DIFFERENTIAL/PLATELET
Abs Immature Granulocytes: 0.7 10*3/uL — ABNORMAL HIGH (ref 0.00–0.07)
Basophils Absolute: 0 10*3/uL (ref 0.0–0.1)
Basophils Relative: 0 %
Eosinophils Absolute: 0 10*3/uL (ref 0.0–0.5)
Eosinophils Relative: 0 %
HCT: 29.6 % — ABNORMAL LOW (ref 39.0–52.0)
Hemoglobin: 10.8 g/dL — ABNORMAL LOW (ref 13.0–17.0)
Lymphocytes Relative: 14 %
Lymphs Abs: 2.5 10*3/uL (ref 0.7–4.0)
MCH: 27.3 pg (ref 26.0–34.0)
MCHC: 36.5 g/dL — ABNORMAL HIGH (ref 30.0–36.0)
MCV: 74.7 fL — ABNORMAL LOW (ref 80.0–100.0)
Metamyelocytes Relative: 2 %
Monocytes Absolute: 2.3 10*3/uL — ABNORMAL HIGH (ref 0.1–1.0)
Monocytes Relative: 13 %
Myelocytes: 2 %
Neutro Abs: 12.3 10*3/uL — ABNORMAL HIGH (ref 1.7–7.7)
Neutrophils Relative %: 69 %
Platelets: 379 10*3/uL (ref 150–400)
RBC: 3.96 MIL/uL — ABNORMAL LOW (ref 4.22–5.81)
RDW: 14.6 % (ref 11.5–15.5)
WBC: 17.8 10*3/uL — ABNORMAL HIGH (ref 4.0–10.5)
nRBC: 1.9 % — ABNORMAL HIGH (ref 0.0–0.2)
nRBC: 2 /100 WBC — ABNORMAL HIGH

## 2020-10-14 LAB — HEPATIC FUNCTION PANEL
ALT: 24 U/L (ref 0–44)
AST: 63 U/L — ABNORMAL HIGH (ref 15–41)
Albumin: 4.3 g/dL (ref 3.5–5.0)
Alkaline Phosphatase: 80 U/L (ref 38–126)
Bilirubin, Direct: 0.2 mg/dL (ref 0.0–0.2)
Indirect Bilirubin: 1 mg/dL — ABNORMAL HIGH (ref 0.3–0.9)
Total Bilirubin: 1.2 mg/dL (ref 0.3–1.2)
Total Protein: 7.3 g/dL (ref 6.5–8.1)

## 2020-10-14 LAB — BASIC METABOLIC PANEL
Anion gap: 11 (ref 5–15)
BUN: 10 mg/dL (ref 6–20)
CO2: 24 mmol/L (ref 22–32)
Calcium: 8.9 mg/dL (ref 8.9–10.3)
Chloride: 105 mmol/L (ref 98–111)
Creatinine, Ser: 1.31 mg/dL — ABNORMAL HIGH (ref 0.61–1.24)
GFR, Estimated: >60 mL/min (ref >60)
Glucose, Bld: 121 mg/dL — ABNORMAL HIGH (ref 70–99)
Potassium: 4 mmol/L (ref 3.5–5.1)
Sodium: 140 mmol/L (ref 135–145)

## 2020-10-14 LAB — URINALYSIS, ROUTINE W REFLEX MICROSCOPIC
Bilirubin Urine: NEGATIVE
Glucose, UA: NEGATIVE mg/dL
Hgb urine dipstick: NEGATIVE
Ketones, ur: NEGATIVE mg/dL
Leukocytes,Ua: NEGATIVE
Nitrite: NEGATIVE
Protein, ur: NEGATIVE mg/dL
Specific Gravity, Urine: 1.015 (ref 1.005–1.030)
pH: 6 (ref 5.0–8.0)

## 2020-10-14 LAB — LIPASE, BLOOD: Lipase: 49 U/L (ref 11–51)

## 2020-10-14 MED ORDER — SODIUM CHLORIDE 0.45 % IV SOLN
INTRAVENOUS | Status: DC
Start: 2020-10-14 — End: 2020-10-20

## 2020-10-14 MED ORDER — HYDROMORPHONE HCL 1 MG/ML IJ SOLN
1.0000 mg | INTRAMUSCULAR | Status: AC
  Administered 2020-10-14: 1 mg via INTRAVENOUS
  Filled 2020-10-14: qty 1

## 2020-10-14 MED ORDER — MORPHINE SULFATE (PF) 4 MG/ML IV SOLN
4.0000 mg | INTRAVENOUS | Status: AC | PRN
Start: 2020-10-14 — End: 2020-10-14
  Administered 2020-10-14 (×4): 4 mg via INTRAVENOUS
  Filled 2020-10-14 (×4): qty 1

## 2020-10-14 MED ORDER — KETOROLAC TROMETHAMINE 15 MG/ML IJ SOLN
15.0000 mg | Freq: Four times a day (QID) | INTRAMUSCULAR | Status: DC
  Administered 2020-10-15 – 2020-10-16 (×5): 15 mg via INTRAVENOUS
  Filled 2020-10-14 (×5): qty 1

## 2020-10-14 MED ORDER — HYDROMORPHONE HCL 1 MG/ML IJ SOLN
0.5000 mg | Freq: Once | INTRAMUSCULAR | Status: AC
  Administered 2020-10-14: 0.5 mg via INTRAVENOUS
  Filled 2020-10-14: qty 1

## 2020-10-14 MED ORDER — SENNOSIDES-DOCUSATE SODIUM 8.6-50 MG PO TABS
1.0000 | ORAL_TABLET | Freq: Two times a day (BID) | ORAL | Status: DC
  Administered 2020-10-14 – 2020-10-20 (×7): 1 via ORAL
  Filled 2020-10-14 (×8): qty 1

## 2020-10-14 MED ORDER — OXYCODONE-ACETAMINOPHEN 5-325 MG PO TABS
1.0000 | ORAL_TABLET | Freq: Once | ORAL | Status: AC
  Administered 2020-10-14: 1 via ORAL
  Filled 2020-10-14: qty 1

## 2020-10-14 MED ORDER — MORPHINE SULFATE (PF) 4 MG/ML IV SOLN
4.0000 mg | INTRAVENOUS | Status: DC | PRN
  Administered 2020-10-14: 4 mg via INTRAVENOUS
  Filled 2020-10-14: qty 1

## 2020-10-14 MED ORDER — HYDROMORPHONE HCL 1 MG/ML IJ SOLN
0.5000 mg | INTRAMUSCULAR | Status: AC
  Administered 2020-10-14: 0.5 mg via INTRAVENOUS
  Filled 2020-10-14: qty 1

## 2020-10-14 MED ORDER — POLYETHYLENE GLYCOL 3350 17 G PO PACK
17.0000 g | PACK | Freq: Every day | ORAL | Status: DC | PRN
  Administered 2020-10-19: 17 g via ORAL
  Filled 2020-10-14: qty 1

## 2020-10-14 MED ORDER — DIPHENHYDRAMINE HCL 25 MG PO CAPS
25.0000 mg | ORAL_CAPSULE | ORAL | Status: DC | PRN
  Administered 2020-10-14: 50 mg via ORAL
  Filled 2020-10-14: qty 2
  Filled 2020-10-14: qty 1

## 2020-10-14 MED ORDER — KETOROLAC TROMETHAMINE 15 MG/ML IJ SOLN
15.0000 mg | INTRAMUSCULAR | Status: AC
  Administered 2020-10-14: 15 mg via INTRAVENOUS
  Filled 2020-10-14: qty 1

## 2020-10-14 MED ORDER — MORPHINE SULFATE (PF) 4 MG/ML IV SOLN
4.0000 mg | INTRAVENOUS | Status: DC | PRN
Start: 2020-10-14 — End: 2020-10-15
  Administered 2020-10-14 – 2020-10-15 (×4): 4 mg via INTRAVENOUS
  Filled 2020-10-14 (×4): qty 1

## 2020-10-14 MED ORDER — MORPHINE SULFATE (PF) 4 MG/ML IV SOLN
4.0000 mg | Freq: Once | INTRAVENOUS | Status: AC
  Administered 2020-10-14: 4 mg via INTRAVENOUS
  Filled 2020-10-14: qty 1

## 2020-10-14 MED ORDER — OXYCODONE-ACETAMINOPHEN 5-325 MG PO TABS
2.0000 | ORAL_TABLET | Freq: Once | ORAL | Status: AC
  Administered 2020-10-14: 2 via ORAL
  Filled 2020-10-14: qty 2

## 2020-10-14 MED ORDER — ONDANSETRON HCL 4 MG/2ML IJ SOLN
4.0000 mg | INTRAMUSCULAR | Status: DC | PRN
  Administered 2020-10-14: 4 mg via INTRAVENOUS
  Filled 2020-10-14: qty 2

## 2020-10-14 NOTE — Plan of Care (Signed)
  Problem: Education: Goal: Knowledge of vaso-occlusive preventative measures will improve Outcome: Progressing Goal: Awareness of infection prevention will improve Outcome: Progressing   Problem: Self-Care: Goal: Ability to incorporate actions that prevent/reduce pain crisis will improve Outcome: Progressing   Problem: Respiratory: Goal: Acute Chest Syndrome will be identified early to prevent complications Outcome: Progressing   Problem: Sensory: Goal: Pain level will decrease with appropriate interventions Outcome: Progressing
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
## 2020-10-14 NOTE — ED Notes (Signed)
Shortly after giving dilaudid O2 sat dropped to 88-89%.  Placed on 2L via [place].  Immediately up to 97%.
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
## 2020-10-15 ENCOUNTER — Inpatient Hospital Stay (HOSPITAL_COMMUNITY): Payer: Self-pay

## 2020-10-15 DIAGNOSIS — R9389 Abnormal findings on diagnostic imaging of other specified body structures: Secondary | ICD-10-CM

## 2020-10-15 DIAGNOSIS — C787 Secondary malignant neoplasm of liver and intrahepatic bile duct: Secondary | ICD-10-CM

## 2020-10-15 DIAGNOSIS — F17200 Nicotine dependence, unspecified, uncomplicated: Secondary | ICD-10-CM

## 2020-10-15 DIAGNOSIS — D72829 Elevated white blood cell count, unspecified: Secondary | ICD-10-CM

## 2020-10-15 DIAGNOSIS — I10 Essential (primary) hypertension: Secondary | ICD-10-CM

## 2020-10-15 LAB — HIV ANTIBODY (ROUTINE TESTING W REFLEX): HIV Screen 4th Generation wRfx: NONREACTIVE

## 2020-10-15 MED ORDER — HYDROMORPHONE 1 MG/ML IV SOLN
INTRAVENOUS | Status: DC
  Administered 2020-10-15: 30 mg via INTRAVENOUS
  Administered 2020-10-15: 0.3 mg via INTRAVENOUS
  Administered 2020-10-15: 0.6 mg via INTRAVENOUS
  Administered 2020-10-16: 0.3 mg via INTRAVENOUS
  Administered 2020-10-16: 2.1 mg via INTRAVENOUS
  Administered 2020-10-16: 1.8 mg via INTRAVENOUS
  Administered 2020-10-16: 1.2 mg via INTRAVENOUS
  Administered 2020-10-16: 1.5 mg via INTRAVENOUS
  Administered 2020-10-17: 0.3 mg via INTRAVENOUS
  Administered 2020-10-17 (×2): 0.9 mg via INTRAVENOUS
  Administered 2020-10-17: 2.1 mg via INTRAVENOUS
  Administered 2020-10-17: 0 mg via INTRAVENOUS
  Administered 2020-10-17: 0.9 mg via INTRAVENOUS
  Administered 2020-10-18: 2.4 mg via INTRAVENOUS
  Administered 2020-10-18: 30 mg via INTRAVENOUS
  Administered 2020-10-18: 1.5 mg via INTRAVENOUS
  Administered 2020-10-18: 0 mg via INTRAVENOUS
  Administered 2020-10-18: 0.3 mg via INTRAVENOUS
  Filled 2020-10-15 (×2): qty 30

## 2020-10-15 MED ORDER — LORAZEPAM 2 MG/ML IJ SOLN: 1.0000 mg | Freq: Once | INTRAMUSCULAR | Status: DC

## 2020-10-15 MED ORDER — AMLODIPINE BESYLATE 10 MG PO TABS
10.0000 mg | ORAL_TABLET | Freq: Every day | ORAL | Status: DC
  Administered 2020-10-15 – 2020-10-20 (×6): 10 mg via ORAL
  Filled 2020-10-15 (×6): qty 1

## 2020-10-15 MED ORDER — CLONIDINE HCL 0.1 MG PO TABS
0.1000 mg | ORAL_TABLET | Freq: Three times a day (TID) | ORAL | Status: DC | PRN
  Administered 2020-10-16 – 2020-10-18 (×4): 0.1 mg via ORAL
  Filled 2020-10-15 (×4): qty 1

## 2020-10-15 MED ORDER — ONDANSETRON HCL 4 MG/2ML IJ SOLN: 4.0000 mg | Freq: Four times a day (QID) | INTRAMUSCULAR | Status: DC | PRN

## 2020-10-15 MED ORDER — SODIUM CHLORIDE 0.9% FLUSH: 9.0000 mL | INTRAVENOUS | Status: DC | PRN

## 2020-10-15 MED ORDER — NALOXONE HCL 0.4 MG/ML IJ SOLN: 0.4000 mg | INTRAMUSCULAR | Status: DC | PRN

## 2020-10-15 NOTE — Progress Notes (Signed)
No continuous pulse ox available. Patient was instructed on how to use the PCA.

## 2020-10-15 NOTE — Progress Notes (Signed)
Ronald Hurst's BP is 145/120, Dr. Darrick Meigs notified and he put in orders. He was started on Norvasc, Night nurse informed of patient' BP and that I had given him Norvasc. His BP needed to be rechecked. Girlfriend at bedside

## 2020-10-15 NOTE — Progress Notes (Signed)
Failed attempt at MRI. RN unable to come to pt room at this time. Will attempt MRI later this evening.

## 2020-10-15 NOTE — Progress Notes (Signed)
Subjective: Ronald Hurst is a 45 year old male with a medical history significant for sickle cell disease and tobacco dependence was admitted for sickle cell pain crisis.  Patient typically has very well-controlled sickle cell disease with infrequent crises.  He has mostly opiate nave. Pain intensity is 9/10 primarily to central chest, low back, and lower extremities.  Patient denies any dizziness, headache, paresthesias, shortness of breath, nausea, vomiting, or diarrhea.   Objective:  Vital signs in last 24 hours:  Vitals:   10/15/20 2354 10/16/20 0231 10/16/20 0406 10/16/20 0534  BP:  (!) 149/103  (!) 148/106  Pulse:  91  88  Resp: 16 17 18 17   Temp:  98.8 F (37.1 C)  98.9 F (37.2 C)  TempSrc:  Oral  Oral  SpO2: 96% 96% 96% 90%  Weight:      Height:        Intake/Output from previous day:   Intake/Output Summary (Last 24 hours) at 10/16/2020 0557 Last data filed at 10/15/2020 1604 Gross per 24 hour  Intake 2127.45 ml  Output 300 ml  Net 1827.45 ml    Physical Exam: General: Alert, awake, oriented x3, in moderate distress. HEENT: [place]/AT PEERL, EOMI Neck: Trachea midline,  no masses, no thyromegal,y no JVD, no carotid bruit OROPHARYNX: Dry, white exudate.  No erythema/lesions.  Heart: Regular rate and rhythm, without murmurs, rubs, gallops, PMI non-displaced, no heaves or thrills on palpation.  Lungs: Clear to auscultation, no wheezing or rhonchi noted. No increased vocal fremitus resonant to percussion  Abdomen: Soft, nontender, nondistended, positive bowel sounds, no masses no hepatosplenomegaly noted..  Neuro: No focal neurological deficits noted cranial nerves II through XII grossly intact. DTRs 2+ bilaterally upper and lower extremities. Strength 5 out of 5 in bilateral upper and lower extremities. Musculoskeletal: No warm swelling or erythema around joints, no spinal tenderness noted. Psychiatric: Patient alert and oriented x3, good insight and cognition,  good recent to remote recall. Lymph node survey: No cervical axillary or inguinal lymphadenopathy noted.  Lab Results:  Basic Metabolic Panel:    Component Value Date/Time   NA 140 10/14/2020 0021   NA 140 09/02/2020 0957   K 4.0 10/14/2020 0021   CL 105 10/14/2020 0021   CO2 24 10/14/2020 0021   BUN 10 10/14/2020 0021   BUN 10 09/02/2020 0957   CREATININE 1.31 (H) 10/14/2020 0021   GLUCOSE 121 (H) 10/14/2020 0021   CALCIUM 8.9 10/14/2020 0021   CBC:    Component Value Date/Time   WBC 17.8 (H) 10/14/2020 0021   HGB 10.8 (L) 10/14/2020 0021   HGB 12.8 (L) 09/02/2020 0957   HCT 29.6 (L) 10/14/2020 0021   HCT 37.0 (L) 09/02/2020 0957   PLT 379 10/14/2020 0021   PLT 480 (H) 09/02/2020 0957   MCV 74.7 (L) 10/14/2020 0021   MCV 82 09/02/2020 0957   NEUTROABS 12.3 (H) 10/14/2020 0021   NEUTROABS 4.8 09/02/2020 0957   LYMPHSABS 2.5 10/14/2020 0021   LYMPHSABS 2.7 09/02/2020 0957   MONOABS 2.3 (H) 10/14/2020 0021   EOSABS 0.0 10/14/2020 0021   EOSABS 0.2 09/02/2020 0957   BASOSABS 0.0 10/14/2020 0021   BASOSABS 0.1 09/02/2020 0957    Recent Results (from the past 240 hour(s))  SARS CORONAVIRUS 2 (TAT 6-24 HRS) Nasopharyngeal Nasopharyngeal Swab     Status: None   Collection Time: 10/14/20  5:25 AM   Specimen: Nasopharyngeal Swab  Result Value Ref Range Status   SARS Coronavirus 2 NEGATIVE NEGATIVE Final  Comment: (NOTE) SARS-CoV-2 target nucleic acids are NOT DETECTED.  The SARS-CoV-2 RNA is generally detectable in upper and lower respiratory specimens during the acute phase of infection. Negative results do not preclude SARS-CoV-2 infection, do not rule out co-infections with other pathogens, and should not be used as the sole basis for treatment or other patient management decisions. Negative results must be combined with clinical observations, patient history, and epidemiological information. The expected result is Negative.  Fact Sheet for  Patients: SugarRoll.be  Fact Sheet for Healthcare Providers: https://www.woods-mathews.com/  This test is not yet approved or cleared by the [place] FDA and  has been authorized for detection and/or diagnosis of SARS-CoV-2 by FDA under an Emergency Use Authorization (EUA). This EUA will remain  in effect (meaning this test can be used) for the duration of the COVID-19 declaration under Se ction 564(b)(1) of the Act, 21 U.S.C. section 360bbb-3(b)(1), unless the authorization is terminated or revoked sooner.  Performed at [facility], [place] [address] [place], [place] [address]     Studies/Results: No results found.  Medications: Scheduled Meds: . amLODipine  10 mg Oral Daily  . HYDROmorphone   Intravenous Q4H  . ketorolac  15 mg Intravenous Q6H  . LORazepam  1 mg Intravenous Once  . senna-docusate  1 tablet Oral BID   Continuous Infusions: . sodium chloride 100 mL/hr at 10/15/20 2355   PRN Meds:.cloNIDine, diphenhydrAMINE, naloxone **AND** sodium chloride flush, ondansetron (ZOFRAN) IV, polyethylene glycol  Consultants:  Interventional radiology  Procedures:  CT of abdomen with and without contrast  Antibiotics:  None  Assessment/Plan: Principal Problem:   Sickle cell pain crisis ([place]) Active Problems:   Essential hypertension   Cancer, metastatic to liver (HCC)  Sickle cell disease with pain crisis: Pain not controlled on IV morphine.  Initiate IV Dilaudid PCA, full dose. Hold IV Toradol due to acute kidney injury Monitor vital signs very closely, reevaluate pain scale regularly, and supplemental oxygen as needed  Abnormal CT: Renal stone study shows multiple solid nodules in the liver, suspicious for metastasis, asymmetric wall thickening in the rectum.  Given the presence of liver lesions, rectal carcinoma is probable.  Also, cholelithiasis without evidence of cholecystitis.  Small calcified spleen with  consistent sickle cell changes.  Low-attenuation lesions in both kidneys likely representing cysts.  No renal or ureteral stone or obstruction.  Aortic atherosclerosis.  Linear atelectasis in the lung bases. Biopsy per interventional radiology pending. IR recommends of the abdomen prior to biopsy.  Awaiting MRI results. Patient will more that likely warrant oncologists input  Leukocytosis: WBC 17.8.  Patient currently has abnormal CT.  Afebrile.  No signs of acute infection.  Follow closely without antibiotics at this time.  Repeat CBC in a.m.  Sickle cell anemia: Hemoglobin 10.9, appears to be consistent with patient's baseline.  There is no clinical indication for blood transfusion at this time.  Continue to monitor closely.  CBC in a.m.  Acute kidney injury: Creatinine 1.31.  Patient appears to be mildly dehydrated.  Continue hydration.  Discontinue Toradol and avoid all nephrotoxins.  BMP in a.m.  Tobacco dependence: Smoking cessation instruction/counseling given:  counseled patient on the dangers of tobacco use, advised patient to stop smoking, and reviewed strategies to maximize success Nicotine patches offered     Code Status: Full Code Family Communication: Patient's girlfriend Milas Gain is at the bedside.  Disposition Plan: Not yet ready for discharge  [name]  APRN, MSN, FNP-C [facility] [place], [place] [address] [phone]  If 5PM-8AM, please contact night-coverage.  10/16/2020, 5:57 AM  LOS: 2 days

## 2020-10-16 ENCOUNTER — Inpatient Hospital Stay (HOSPITAL_COMMUNITY): Payer: Self-pay

## 2020-10-16 DIAGNOSIS — D5700 Hb-SS disease with crisis, unspecified: Principal | ICD-10-CM

## 2020-10-16 DIAGNOSIS — R9389 Abnormal findings on diagnostic imaging of other specified body structures: Secondary | ICD-10-CM

## 2020-10-16 LAB — COMPREHENSIVE METABOLIC PANEL
ALT: 30 U/L (ref 0–44)
AST: 52 U/L — ABNORMAL HIGH (ref 15–41)
Albumin: 3.5 g/dL (ref 3.5–5.0)
Alkaline Phosphatase: 198 U/L — ABNORMAL HIGH (ref 38–126)
Anion gap: 11 (ref 5–15)
BUN: 17 mg/dL (ref 6–20)
CO2: 26 mmol/L (ref 22–32)
Calcium: 8.4 mg/dL — ABNORMAL LOW (ref 8.9–10.3)
Chloride: 101 mmol/L (ref 98–111)
Creatinine, Ser: 1.1 mg/dL (ref 0.61–1.24)
GFR, Estimated: >60 mL/min (ref >60)
Glucose, Bld: 119 mg/dL — ABNORMAL HIGH (ref 70–99)
Potassium: 3.8 mmol/L (ref 3.5–5.1)
Sodium: 138 mmol/L (ref 135–145)
Total Bilirubin: 2.3 mg/dL — ABNORMAL HIGH (ref 0.3–1.2)
Total Protein: 6.8 g/dL (ref 6.5–8.1)

## 2020-10-16 LAB — CBC WITH DIFFERENTIAL/PLATELET
Abs Immature Granulocytes: 0.1 10*3/uL — ABNORMAL HIGH (ref 0.00–0.07)
Basophils Absolute: 0 10*3/uL (ref 0.0–0.1)
Basophils Relative: 0 %
Eosinophils Absolute: 0.1 10*3/uL (ref 0.0–0.5)
Eosinophils Relative: 1 %
HCT: 26.4 % — ABNORMAL LOW (ref 39.0–52.0)
Hemoglobin: 9.6 g/dL — ABNORMAL LOW (ref 13.0–17.0)
Immature Granulocytes: 1 %
Lymphocytes Relative: 13 %
Lymphs Abs: 1.8 10*3/uL (ref 0.7–4.0)
MCH: 27.2 pg (ref 26.0–34.0)
MCHC: 36.4 g/dL — ABNORMAL HIGH (ref 30.0–36.0)
MCV: 74.8 fL — ABNORMAL LOW (ref 80.0–100.0)
Monocytes Absolute: 1 10*3/uL (ref 0.1–1.0)
Monocytes Relative: 7 %
Neutro Abs: 10.4 10*3/uL — ABNORMAL HIGH (ref 1.7–7.7)
Neutrophils Relative %: 78 %
Platelets: 197 10*3/uL (ref 150–400)
RBC: 3.53 MIL/uL — ABNORMAL LOW (ref 4.22–5.81)
RDW: 16.1 % — ABNORMAL HIGH (ref 11.5–15.5)
WBC: 13.4 10*3/uL — ABNORMAL HIGH (ref 4.0–10.5)
nRBC: 2.3 % — ABNORMAL HIGH (ref 0.0–0.2)

## 2020-10-16 MED ORDER — NICOTINE 21 MG/24HR TD PT24
21.0000 mg | MEDICATED_PATCH | Freq: Every day | TRANSDERMAL | Status: DC
  Filled 2020-10-16 (×4): qty 1

## 2020-10-16 MED ORDER — GADOBUTROL 1 MMOL/ML IV SOLN
7.0000 mL | Freq: Once | INTRAVENOUS | Status: AC | PRN
  Administered 2020-10-16: 7 mL via INTRAVENOUS

## 2020-10-16 MED ORDER — OXYCODONE HCL 5 MG PO TABS
10.0000 mg | ORAL_TABLET | ORAL | Status: DC | PRN
  Administered 2020-10-16 – 2020-10-18 (×4): 10 mg via ORAL
  Filled 2020-10-16 (×6): qty 2

## 2020-10-16 MED FILL — Hydromorphone HCl Inj 2 MG/ML: INTRAMUSCULAR | Qty: 1 | Status: AC

## 2020-10-16 NOTE — Progress Notes (Signed)
Subjective: Ronald Hurst is a 45 year old male with a medical history significant for sickle cell disease and tobacco dependence that was admitted for sickle cell pain crisis.  Also, patient had an abnormal renal stone study on 10/14/2020 that showed multiple solid nodules in the liver that were suspicious for metastasis.  At that time, a liver biopsy was ordered.  Radiologist recommended reviewing MRI of abdomen prior to liver biopsy.  MRI of abdomen showed a 3.2 cm benign hemangioma in the right lobe.  There was no evidence of malignancy.  Liver biopsy canceled.  Discussed with patient.  Patient continues to complain of pain primarily to low back and lower extremities.  Pain intensity is 7/10.  He denies any headache, chest pain, abdominal pain, nausea, vomiting, or diarrhea.  Objective:  Vital signs in last 24 hours:  Vitals:   10/16/20 1221 10/16/20 1326 10/16/20 1554 10/16/20 1557  BP:  (!) 140/96 (!) 142/90   Pulse:  89 97   Resp: 11 18 17  (!) 21  Temp:  99.5 F (37.5 C) 99.3 F (37.4 C)   TempSrc:  Oral Oral   SpO2: 100% 97% 99% 100%  Weight:      Height:        Intake/Output from previous day:   Intake/Output Summary (Last 24 hours) at 10/16/2020 1629 Last data filed at 10/16/2020 1547 Gross per 24 hour  Intake 2739.03 ml  Output 1200 ml  Net 1539.03 ml    Physical Exam: General: Alert, awake, oriented x3, in no acute distress.  HEENT: [place]/AT [place]ER[place], EOMI Neck: Trachea midline,  no masses, no thyromegal,y no JVD, no carotid bruit OROPHARYNX:  Moist, No exudate/ erythema/lesions.  Heart: Regular rate and rhythm, without murmurs, rubs, gallops, PMI non-displaced, no heaves or thrills on palpation.  Lungs: Clear to auscultation, no wheezing or rhonchi noted. No increased vocal fremitus resonant to percussion  Abdomen: Soft, nontender, nondistended, positive bowel sounds, no masses no hepatosplenomegaly noted..  Neuro: No focal neurological deficits noted cranial  nerves II through XII grossly intact. DTRs 2+ bilaterally upper and lower extremities. Strength 5 out of 5 in bilateral upper and lower extremities. Musculoskeletal: No warm swelling or erythema around joints, no spinal tenderness noted. Psychiatric: Patient alert and oriented x3, good insight and cognition, good recent to remote recall. Lymph node survey: No cervical axillary or inguinal lymphadenopathy noted.  Lab Results:  Basic Metabolic Panel:    Component Value Date/Time   NA 138 10/16/2020 0626   NA 140 09/02/2020 0957   K 3.8 10/16/2020 0626   CL 101 10/16/2020 0626   CO2 26 10/16/2020 0626   BUN 17 10/16/2020 0626   BUN 10 09/02/2020 0957   CREATININE 1.10 10/16/2020 0626   GLUCOSE 119 (H) 10/16/2020 0626   CALCIUM 8.4 (L) 10/16/2020 0626   CBC:    Component Value Date/Time   WBC 13.4 (H) 10/16/2020 0626   HGB 9.6 (L) 10/16/2020 0626   HGB 12.8 (L) 09/02/2020 0957   HCT 26.4 (L) 10/16/2020 0626   HCT 37.0 (L) 09/02/2020 0957   PLT 197 10/16/2020 0626   PLT 480 (H) 09/02/2020 0957   MCV 74.8 (L) 10/16/2020 0626   MCV 82 09/02/2020 0957   NEUTROABS 10.4 (H) 10/16/2020 0626   NEUTROABS 4.8 09/02/2020 0957   LYMPHSABS 1.8 10/16/2020 0626   LYMPHSABS 2.7 09/02/2020 0957   MONOABS 1.0 10/16/2020 0626   EOSABS 0.1 10/16/2020 0626   EOSABS 0.2 09/02/2020 0957   BASOSABS 0.0 10/16/2020 8756  BASOSABS 0.1 09/02/2020 0957    Recent Results (from the past 240 hour(s))  SARS CORONAVIRUS 2 (TAT 6-24 HRS) Nasopharyngeal Nasopharyngeal Swab     Status: None   Collection Time: 10/14/20  5:25 AM   Specimen: Nasopharyngeal Swab  Result Value Ref Range Status   SARS Coronavirus 2 NEGATIVE NEGATIVE Final    Comment: (NOTE) SARS-CoV-2 target nucleic acids are NOT DETECTED.  The SARS-CoV-2 RNA is generally detectable in upper and lower respiratory specimens during the acute phase of infection. Negative results do not preclude SARS-CoV-2 infection, do not rule  out co-infections with other pathogens, and should not be used as the sole basis for treatment or other patient management decisions. Negative results must be combined with clinical observations, patient history, and epidemiological information. The expected result is Negative.  Fact Sheet for Patients: SugarRoll.be  Fact Sheet for Healthcare Providers: https://www.woods-mathews.com/  This test is not yet approved or cleared by the [place] FDA and  has been authorized for detection and/or diagnosis of SARS-CoV-2 by FDA under an Emergency Use Authorization (EUA). This EUA will remain  in effect (meaning this test can be used) for the duration of the COVID-19 declaration under Se ction 564(b)(1) of the Act, 21 U.S.C. section 360bbb-3(b)(1), unless the authorization is terminated or revoked sooner.  Performed at [facility], [place] [address] [place], [place] [address]     Studies/Results: MR ABDOMEN W WO CONTRAST  Result Date: 10/16/2020 CLINICAL DATA:  Liver lesions on recent noncontrast CT. Sickle cell anemia. EXAM: MRI ABDOMEN WITHOUT AND WITH CONTRAST TECHNIQUE: Multiplanar multisequence MR imaging of the abdomen was performed both before and after the administration of intravenous contrast. CONTRAST:  28mL GADAVIST GADOBUTROL 1 MMOL/ML IV SOLN COMPARISON:  Noncontrast CT on 10/14/2020, and FINDINGS: Lower chest: No acute findings. Hepatobiliary: A 3.2 cm mass is seen in the anterior segment of the right lobe which shows marked T2 hyperintensity and progressive nodular contrast enhancement, consistent with a benign hemangioma. An adjacent tiny sub-cm cyst is also seen. No other liver masses are identified. Gallstones are seen, however there is no evidence of cholecystitis or biliary dilatation. Pancreas:  No mass or inflammatory changes. 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 Adrenals/Urinary Tract: No masses identified. Several renal cysts are noted bilaterally. No evidence of hydronephrosis. Stomach/Bowel: Visualized portion unremarkable. Vascular/Lymphatic: No pathologically enlarged lymph nodes identified. No abdominal aortic aneurysm. Other:  None. Musculoskeletal: Heterogeneous T2 hyperintensity is seen within the L2, L3, and L4 vertebral bodies, without associated contrast enhancement. 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 IMPRESSION: 3.2 cm benign hemangioma in the right lobe. No evidence of malignancy. Cholelithiasis. No radiographic evidence of cholecystitis or biliary dilatation. 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 Electronically Signed   By: Marlaine Hind M.D.   On: 10/16/2020 09:06    Medications: Scheduled Meds: . amLODipine  10 mg Oral Daily  . HYDROmorphone   Intravenous Q4H  . LORazepam  1 mg Intravenous Once  . nicotine  21 mg Transdermal Daily  . senna-docusate  1 tablet Oral BID   Continuous Infusions: . sodium chloride 100 mL/hr at 10/16/20 1547   PRN Meds:.cloNIDine, diphenhydrAMINE, naloxone **AND** sodium chloride flush, ondansetron (ZOFRAN) IV, oxyCODONE, polyethylene glycol  Consultants:  None  Procedures:  None  Antibiotics:  None  Assessment/Plan: Principal Problem:   Sickle  cell pain crisis ([place]) Active Problems:   Tobacco dependence   Leukocytosis   Essential hypertension   Cancer, metastatic to liver (HCC)   Abnormal CT scan  Sickle cell disease with pain crisis: Continue IV Dilaudid PCA per full dose, no changes in settings today. AKI resolved, probable dehydration.  Restart Toradol 15 mg every 6 hours for total of 5 days Monitor vital signs very closely, reevaluate pain scale regularly, and supplemental oxygen as needed  Abnormal CT: Patient had an abnormal renal stone study that showed  multiple solid nodules.  Fortunately, abdominal MRI shows a 3.2 cm benign hemangioma of the right low and no evidence of malignancy, cholelithiasis without any radiographic evidence of cholecystitis of biliary dilatation.  No further work-up warranted.  Liver biopsy canceled per IR.  Continue surveillance as an outpatient.  Leukocytosis: WBCs improving.  Patient is afebrile without any signs of infection or inflammation.  Continue to follow closely without antibiotics.  Repeat CBC in a.m.  Sickle cell anemia: Hemoglobin is stable and appears to be consistent with patient's baseline.  There is no clinical indication for blood transfusion at this time.  Continue to monitor closely.  Follow labs in AM.  Acute kidney injury: Resolved.  Creatinine back to baseline.  Was more than likely related to dehydration.  Continue gentle hydration.  Follow labs.  Tobacco dependence: Smoking cessation instruction/counseling given:  counseled patient on the dangers of tobacco use, advised patient to stop smoking, and reviewed strategies to maximize success  Code Status: Full Code Family Communication: N/A Disposition Plan: Not yet ready for discharge  [place], MSN, FNP-C Patient [place], [place] [address] [phone]  If 5PM-7AM, please contact night-coverage.  10/16/2020, 4:29 PM  LOS: 2 days

## 2020-10-16 NOTE — Consult Note (Signed)
IR received consult request for image-guided liver lesion biopsy based off CT renal stone study results. An MRI abdomen today shows benign liver findings and no need for IR intervention/biopsy. Ronald Hurst treatment team has been notified and the order will be deleted.   Please call IR with any questions.  [name],  [phone] 10/16/2020, 9:42 AM

## 2020-10-17 LAB — CBC
HCT: 25.2 % — ABNORMAL LOW (ref 39.0–52.0)
Hemoglobin: 9.2 g/dL — ABNORMAL LOW (ref 13.0–17.0)
MCH: 26.9 pg (ref 26.0–34.0)
MCHC: 36.5 g/dL — ABNORMAL HIGH (ref 30.0–36.0)
MCV: 73.7 fL — ABNORMAL LOW (ref 80.0–100.0)
Platelets: 170 10*3/uL (ref 150–400)
RBC: 3.42 MIL/uL — ABNORMAL LOW (ref 4.22–5.81)
RDW: 15.8 % — ABNORMAL HIGH (ref 11.5–15.5)
WBC: 11.1 10*3/uL — ABNORMAL HIGH (ref 4.0–10.5)
nRBC: 2.5 % — ABNORMAL HIGH (ref 0.0–0.2)

## 2020-10-17 NOTE — Progress Notes (Signed)
Subjective: Ronald Hurst is a 45 year old male with a medical history significant for sickle cell disease and tobacco dependence that was admitted for sickle cell pain crisis.  Patient continues to complain of pain primarily to low back and lower extremities.  Today, pain intensity is 7/10.  Ronald Hurst denies any headache, chest pain, abdominal pain, nausea, vomiting, or diarrhea  Objective:  Vital signs in last 24 hours:  Vitals:   10/17/20 0543 10/17/20 0917 10/17/20 1138 10/17/20 1328  BP: (!) 135/101 (!) 158/103  (!) 152/100  Pulse: (!) 103 88  (!) 101  Resp: 20 14 14 14   Temp: 99 F (37.2 C) 98.5 F (36.9 C)  98.1 F (36.7 C)  TempSrc: Oral Oral  Oral  SpO2: 95% 97% 99% 99%  Weight:      Height:        Intake/Output from previous day:   Intake/Output Summary (Last 24 hours) at 10/17/2020 1429 Last data filed at 10/17/2020 [id number] Gross per 24 hour  Intake 3415.95 ml  Output 1000 ml  Net 2415.95 ml    Physical Exam: General: Alert, awake, oriented x3, in no acute distress.  HEENT: [place]/AT [place]ER[place], EOMI Neck: Trachea midline,  no masses, no thyromegal,y no JVD, no carotid bruit OROPHARYNX:  Moist, No exudate/ erythema/lesions.  Heart: Regular rate and rhythm, without murmurs, rubs, gallops, PMI non-displaced, no heaves or thrills on palpation.  Lungs: Clear to auscultation, no wheezing or rhonchi noted. No increased vocal fremitus resonant to percussion  Abdomen: Soft, nontender, nondistended, positive bowel sounds, no masses no hepatosplenomegaly noted..  Neuro: No focal neurological deficits noted cranial nerves II through XII grossly intact. DTRs 2+ bilaterally upper and lower extremities. Strength 5 out of 5 in bilateral upper and lower extremities. Musculoskeletal: No warm swelling or erythema around joints, no spinal tenderness noted. Psychiatric: Patient alert and oriented x3, good insight and cognition, good recent to remote recall. Lymph node survey: No cervical  axillary or inguinal lymphadenopathy noted.  Lab Results:  Basic Metabolic Panel:    Component Value Date/Time   NA 138 10/16/2020 0626   NA 140 09/02/2020 0957   K 3.8 10/16/2020 0626   CL 101 10/16/2020 0626   CO2 26 10/16/2020 0626   BUN 17 10/16/2020 0626   BUN 10 09/02/2020 0957   CREATININE 1.10 10/16/2020 0626   GLUCOSE 119 (H) 10/16/2020 0626   CALCIUM 8.4 (L) 10/16/2020 0626   CBC:    Component Value Date/Time   WBC 11.1 (H) 10/17/2020 0733   HGB 9.2 (L) 10/17/2020 0733   HGB 12.8 (L) 09/02/2020 0957   HCT 25.2 (L) 10/17/2020 0733   HCT 37.0 (L) 09/02/2020 0957   PLT 170 10/17/2020 0733   PLT 480 (H) 09/02/2020 0957   MCV 73.7 (L) 10/17/2020 0733   MCV 82 09/02/2020 0957   NEUTROABS 10.4 (H) 10/16/2020 0626   NEUTROABS 4.8 09/02/2020 0957   LYMPHSABS 1.8 10/16/2020 0626   LYMPHSABS 2.7 09/02/2020 0957   MONOABS 1.0 10/16/2020 0626   EOSABS 0.1 10/16/2020 0626   EOSABS 0.2 09/02/2020 0957   BASOSABS 0.0 10/16/2020 0626   BASOSABS 0.1 09/02/2020 0957    Recent Results (from the past 240 hour(s))  SARS CORONAVIRUS 2 (TAT 6-24 HRS) Nasopharyngeal Nasopharyngeal Swab     Status: None   Collection Time: 10/14/20  5:25 AM   Specimen: Nasopharyngeal Swab  Result Value Ref Range Status   SARS Coronavirus 2 NEGATIVE NEGATIVE Final    Comment: (NOTE) SARS-CoV-2 target nucleic acids  are NOT DETECTED.  The SARS-CoV-2 RNA is generally detectable in upper and lower respiratory specimens during the acute phase of infection. Negative results do not preclude SARS-CoV-2 infection, do not rule out co-infections with other pathogens, and should not be used as the sole basis for treatment or other patient management decisions. Negative results must be combined with clinical observations, patient history, and epidemiological information. The expected result is Negative.  Fact Sheet for Patients: SugarRoll.be  Fact Sheet for Healthcare  Providers: https://www.woods-mathews.com/  This test is not yet approved or cleared by the [place] FDA and  has been authorized for detection and/or diagnosis of SARS-CoV-2 by FDA under an Emergency Use Authorization (EUA). This EUA will remain  in effect (meaning this test can be used) for the duration of the COVID-19 declaration under Se ction 564(b)(1) of the Act, 21 U.S.C. section 360bbb-3(b)(1), unless the authorization is terminated or revoked sooner.  Performed at [facility], [place] [address], [place], [place] [address]     Studies/Results: MR ABDOMEN W WO CONTRAST  Result Date: 10/16/2020 CLINICAL DATA:  Liver lesions on recent noncontrast CT. Sickle cell anemia. EXAM: MRI ABDOMEN WITHOUT AND WITH CONTRAST TECHNIQUE: Multiplanar multisequence MR imaging of the abdomen was performed both before and after the administration of intravenous contrast. CONTRAST:  98mL GADAVIST GADOBUTROL 1 MMOL/ML IV SOLN COMPARISON:  Noncontrast CT on 10/14/2020, and FINDINGS: Lower chest: No acute findings. Hepatobiliary: A 3.2 cm mass is seen in the anterior segment of the right lobe which shows marked T2 hyperintensity and progressive nodular contrast enhancement, consistent with a benign hemangioma. An adjacent tiny sub-cm cyst is also seen. No other liver masses are identified. Gallstones are seen, however there is no evidence of cholecystitis or biliary dilatation. Pancreas:  No mass or inflammatory changes. 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 Adrenals/Urinary Tract: No masses identified. Several renal cysts are noted bilaterally. No evidence of hydronephrosis. Stomach/Bowel: Visualized portion unremarkable. Vascular/Lymphatic: No pathologically enlarged lymph nodes identified. No abdominal aortic aneurysm. Other:  None. Musculoskeletal: Heterogeneous T2 hyperintensity is seen within the L2, L3, and L4  vertebral bodies, without associated contrast enhancement. 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 IMPRESSION: 3.2 cm benign hemangioma in the right lobe. No evidence of malignancy. Cholelithiasis. No radiographic evidence of cholecystitis or biliary dilatation. 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 Electronically Signed   By: Marlaine Hind M.D.   On: 10/16/2020 09:06    Medications: Scheduled Meds: . amLODipine  10 mg Oral Daily  . HYDROmorphone   Intravenous Q4H  . nicotine  21 mg Transdermal Daily  . senna-docusate  1 tablet Oral BID   Continuous Infusions: . sodium chloride 100 mL/hr at 10/17/20 0326   PRN Meds:.cloNIDine, diphenhydrAMINE, naloxone **AND** sodium chloride flush, ondansetron (ZOFRAN) IV, oxyCODONE, polyethylene glycol  Consultants:  None  Procedures:  None   Antibiotics:  None  Assessment/Plan: Principal Problem:   Sickle cell pain crisis ([place]) Active Problems:   Tobacco dependence   Leukocytosis   Essential hypertension   Cancer, metastatic to liver (HCC)   Abnormal CT scan  Sickle cell disease with pain crisis: Continue IV Dilaudid PCA per full dose. Toradol 15 mg IV every 6 hours for total of 5 days Monitor vital signs very closely, reevaluate pain scale regularly, and supplemental oxygen as needed.  Leukocytosis: Improving.  Patient continues to be afebrile without any signs of infection or inflammation.  Continue to follow closely without antibiotics.  Repeat CBC in a.m.  Sickle cell anemia: Pain level remains stable and appears to be consistent with his baseline.  There is no clinical indication for blood transfusion at this time.  Continue to follow closely.  Acute kidney injury: Resolved.  Continue gentle hydration.  Follow labs.  Tobacco dependence: Smoking cessation instruction/counseling given:  counseled patient on the dangers  of tobacco use, advised patient to stop smoking, and reviewed strategies to maximize success  Nicotine patch in place   Code Status: Full Code Family Communication: N/A Disposition Plan: Not yet ready for discharge  [place], MSN, FNP-C Patient [place], [place] [address] [phone]  If 5PM-8AM, please contact night-coverage.  10/17/2020, 2:29 PM  LOS: 3 days

## 2020-10-18 MED ORDER — HYDROMORPHONE HCL 2 MG/ML IJ SOLN
2.0000 mg | INTRAMUSCULAR | Status: DC | PRN
  Administered 2020-10-18 – 2020-10-20 (×6): 2 mg via INTRAVENOUS
  Filled 2020-10-18 (×6): qty 1

## 2020-10-18 MED ORDER — OXYCODONE HCL 5 MG PO TABS
15.0000 mg | ORAL_TABLET | ORAL | Status: DC | PRN
  Administered 2020-10-18 – 2020-10-20 (×7): 15 mg via ORAL
  Filled 2020-10-18 (×7): qty 3

## 2020-10-18 NOTE — Progress Notes (Signed)
Subjective: Ronald Hurst is a 45 year old male with a medical history significant for sickle cell disease and tobacco dependence was admitted for sickle cell pain crisis. Patient says that pain has improved some overnight and is primarily to low back and lower extremities.  Pain intensity 6/10.  Patient is ambulating in halls today.  He denies any headache, chest pain, abdominal pain, nausea, vomiting, or diarrhea.  Objective:  Vital signs in last 24 hours:  Vitals:   10/18/20 2037 10/18/20 2048 10/18/20 2147 10/19/20 0036  BP: (!) 144/100 (!) 142/105 124/83 128/79  Pulse: (!) 103 (!) 107 95 93  Resp: 18 20 18 18   Temp: (!) 101.1 F (38.4 C) 99.9 F (37.7 C) 99.6 F (37.6 C) 99.5 F (37.5 C)  TempSrc: Oral Oral Oral Oral  SpO2:  96% 93% 91%  Weight:      Height:        Intake/Output from previous day:   Intake/Output Summary (Last 24 hours) at 10/19/2020 1517 Last data filed at 10/18/2020 1822 Gross per 24 hour  Intake 940 ml  Output 2650 ml  Net -1710 ml    Physical Exam: General: Alert, awake, oriented x3, in no acute distress.  HEENT: [place]/AT [place]ER[place], EOMI Neck: Trachea midline,  no masses, no thyromegal,y no JVD, no carotid bruit OROPHARYNX:  Moist, No exudate/ erythema/lesions.  Heart: Regular rate and rhythm, without murmurs, rubs, gallops, PMI non-displaced, no heaves or thrills on palpation.  Lungs: Clear to auscultation, no wheezing or rhonchi noted. No increased vocal fremitus resonant to percussion  Abdomen: Soft, nontender, nondistended, positive bowel sounds, no masses no hepatosplenomegaly noted..  Neuro: No focal neurological deficits noted cranial nerves II through XII grossly intact. DTRs 2+ bilaterally upper and lower extremities. Strength 5 out of 5 in bilateral upper and lower extremities. Musculoskeletal: No warm swelling or erythema around joints, no spinal tenderness noted. Psychiatric: Patient alert and oriented x3, good insight and cognition,  good recent to remote recall. Lymph node survey: No cervical axillary or inguinal lymphadenopathy noted.  Lab Results:  Basic Metabolic Panel:    Component Value Date/Time   NA 138 10/16/2020 0626   NA 140 09/02/2020 0957   K 3.8 10/16/2020 0626   CL 101 10/16/2020 0626   CO2 26 10/16/2020 0626   BUN 17 10/16/2020 0626   BUN 10 09/02/2020 0957   CREATININE 1.10 10/16/2020 0626   GLUCOSE 119 (H) 10/16/2020 0626   CALCIUM 8.4 (L) 10/16/2020 0626   CBC:    Component Value Date/Time   WBC 11.1 (H) 10/17/2020 0733   HGB 9.2 (L) 10/17/2020 0733   HGB 12.8 (L) 09/02/2020 0957   HCT 25.2 (L) 10/17/2020 0733   HCT 37.0 (L) 09/02/2020 0957   PLT 170 10/17/2020 0733   PLT 480 (H) 09/02/2020 0957   MCV 73.7 (L) 10/17/2020 0733   MCV 82 09/02/2020 0957   NEUTROABS 10.4 (H) 10/16/2020 0626   NEUTROABS 4.8 09/02/2020 0957   LYMPHSABS 1.8 10/16/2020 0626   LYMPHSABS 2.7 09/02/2020 0957   MONOABS 1.0 10/16/2020 0626   EOSABS 0.1 10/16/2020 0626   EOSABS 0.2 09/02/2020 0957   BASOSABS 0.0 10/16/2020 0626   BASOSABS 0.1 09/02/2020 0957    Recent Results (from the past 240 hour(s))  SARS CORONAVIRUS 2 (TAT 6-24 HRS) Nasopharyngeal Nasopharyngeal Swab     Status: None   Collection Time: 10/14/20  5:25 AM   Specimen: Nasopharyngeal Swab  Result Value Ref Range Status   SARS Coronavirus 2 NEGATIVE NEGATIVE  Final    Comment: (NOTE) SARS-CoV-2 target nucleic acids are NOT DETECTED.  The SARS-CoV-2 RNA is generally detectable in upper and lower respiratory specimens during the acute phase of infection. Negative results do not preclude SARS-CoV-2 infection, do not rule out co-infections with other pathogens, and should not be used as the sole basis for treatment or other patient management decisions. Negative results must be combined with clinical observations, patient history, and epidemiological information. The expected result is Negative.  Fact Sheet for  Patients: SugarRoll.be  Fact Sheet for Healthcare Providers: https://www.woods-mathews.com/  This test is not yet approved or cleared by the [place] FDA and  has been authorized for detection and/or diagnosis of SARS-CoV-2 by FDA under an Emergency Use Authorization (EUA). This EUA will remain  in effect (meaning this test can be used) for the duration of the COVID-19 declaration under Se ction 564(b)(1) of the Act, 21 U.S.C. section 360bbb-3(b)(1), unless the authorization is terminated or revoked sooner.  Performed at [facility], [place] [address] [place], [place] [address]     Studies/Results: No results found.  Medications: Scheduled Meds: . amLODipine  10 mg Oral Daily  . nicotine  21 mg Transdermal Daily  . senna-docusate  1 tablet Oral BID   Continuous Infusions: . sodium chloride 10 mL/hr at 10/17/20 1559   PRN Meds:.cloNIDine, diphenhydrAMINE, HYDROmorphone (DILAUDID) injection, oxyCODONE, polyethylene glycol  Consultants:  None  Procedures:  None  Antibiotics:  None  Assessment/Plan: Principal Problem:   Sickle cell pain crisis ([place]) Active Problems:   Tobacco dependence   Leukocytosis   Essential hypertension   Abnormal CT scan  Sickle cell disease with pain crisis: Continue IV Dilaudid PCA per full dose IV Toradol 15 mg every 6 hours for total of 5 days Increase oxycodone to 15 mg every 4 hours as needed for severe breakthrough pain Monitor vital signs closely, reevaluate pain scale regularly, and supplemental oxygen as needed.  Leukocytosis: Stable.  Patient continues to be afebrile without any signs of infection or inflammation.  Follow closely without antibiotics.  CBC in a.m.  Sickle cell anemia: Hemoglobin stable.  No clinical indication for blood transfusion.  Follow closely.  Acute kidney injury: Resolved.  Avoid nephrotoxins.  Continue to monitor closely.  Tobacco  dependence: Smoking cessation instruction/counseling given:  counseled patient on the dangers of tobacco use, advised patient to stop smoking, and reviewed strategies to maximize success   Benign hepatic hemangioma: Patient had abnormal CT that showed multiple solid nodules in the liver that were suspicious for metastasis.  Abdominal MRI revealed 3.2 cm benign hemangioma in the right lobe of liver without evidence of malignancy.  Recommend continued surveillance every 12 months as an outpatient.   Code Status: Full Code Family Communication: N/A Disposition Plan: Not yet ready for discharge.  Discharge planned for 10/19/2020  [name]  APRN, MSN, FNP-C Patient [place], [place] [address] [phone]  If 5PM-8AM, please contact night-coverage.  10/19/2020, 3:38 AM  LOS: 5 days

## 2020-10-19 DIAGNOSIS — D1803 Hemangioma of intra-abdominal structures: Secondary | ICD-10-CM | POA: Diagnosis present

## 2020-10-19 NOTE — Progress Notes (Signed)
   10/19/20 1735  Assess: MEWS Score  Temp (!) 100.8 F (38.2 C)  BP (!) 145/95  Pulse Rate (!) 108  Resp 15  Level of Consciousness Alert  SpO2 94 %  O2 Device Room Air  Patient Activity (if Appropriate) In bed  Assess: MEWS Score  MEWS Temp 1  MEWS Systolic 0  MEWS Pulse 1  MEWS RR 0  MEWS LOC 0  MEWS Score 2  MEWS Score Color Yellow  Assess: if the MEWS score is Yellow or Red  Were vital signs taken at a resting state? Yes  Focused Assessment Change from prior assessment (see assessment flowsheet)  Early Detection of Sepsis Score *See Row Information* Low  MEWS guidelines implemented *See Row Information* Yes  Treat  MEWS Interventions Escalated (See documentation below)  Pain Scale 0-10  Pain Score 8  Pain Type Acute pain  Pain Location Leg  Pain Orientation Right;Left  Pain Descriptors / Indicators Aching  Pain Frequency Constant  Pain Onset On-going  Patients Stated Pain Goal 5  Pain Intervention(s) Medication (See eMAR)  Multiple Pain Sites No  Patients response to intervention Relief  Take Vital Signs  Increase Vital Sign Frequency  Yellow: Q 2hr X 2 then Q 4hr X 2, if remains yellow, continue Q 4hrs  Escalate  MEWS: Escalate Yellow: discuss with charge nurse/RN and consider discussing with provider and RRT  Notify: Charge Nurse/RN  Name of Charge Nurse/RN Notified Kim RN  Date Charge Nurse/RN Notified 10/19/20  Time Charge Nurse/RN Notified 1746  Document  Patient Outcome Stabilized after interventions  Progress note created (see row info) Yes  MEWS Guidelines - (patients age 8 and over)    Pt's temp and HR triggered yellow MEWS protocol. Pt in no acute distress, pt voices concern that elevated temperature may be environmental.

## 2020-10-19 NOTE — Progress Notes (Signed)
Subjective: Patient is a 45 year old admitted with sickle cell crisis.  Has been having pain at 7 out of 10.  Currently on as needed Dilaudid as well likes oxycodone.  Denied any fever or chills no nausea vomiting or diarrhea.  Pain is improving.  Objective: Vital signs in last 24 hours: Temp:  [98.8 F (37.1 C)-101.1 F (38.4 C)] 99.5 F (37.5 C) (01/29 0036) Pulse Rate:  [90-107] 93 (01/29 0036) Resp:  [14-20] 18 (01/29 0036) BP: (124-153)/(79-109) 128/79 (01/29 0036) SpO2:  [86 %-100 %] 91 % (01/29 0036) FiO2 (%):  [0 %] 0 % (01/28 0647) Weight:  [73.9 kg] 73.9 kg (01/28 0601) Weight change:  Last BM Date: 10/13/20  Intake/Output from previous day: 01/28 0701 - 01/29 0700 In: 940 [P.O.:940] Out: 2200 [Urine:2200] Intake/Output this shift: No intake/output data recorded.  General appearance: alert, cooperative, appears stated age and no distress Head: Normocephalic, without obvious abnormality, atraumatic Neck: no adenopathy, no carotid bruit, no JVD, supple, symmetrical, trachea midline and thyroid not enlarged, symmetric, no tenderness/mass/nodules Back: symmetric, no curvature. ROM normal. No CVA tenderness. Resp: clear to auscultation bilaterally Cardio: regular rate and rhythm, S1, S2 normal, no murmur, click, rub or gallop GI: soft, non-tender; bowel sounds normal; no masses,  no organomegaly Extremities: extremities normal, atraumatic, no cyanosis or edema Pulses: 2+ and symmetric Skin: Skin color, texture, turgor normal. No rashes or lesions Neurologic: Grossly normal  Lab Results: Recent Labs    10/16/20 0626 10/17/20 0733  WBC 13.4* 11.1*  HGB 9.6* 9.2*  HCT 26.4* 25.2*  PLT 197 170   BMET Recent Labs    10/16/20 0626  NA 138  K 3.8  CL 101  CO2 26  GLUCOSE 119*  BUN 17  CREATININE 1.10  CALCIUM 8.4*    Studies/Results: No results found.  Medications: I have reviewed the patient's current medications.  Assessment/Plan: #1 sickle cell  painful crisis: Currently on Dilaudid and oxycodone.  Improving.  Continue to titrate.  Patient close to discharge.  Continue IV fluids.  #2 leukocytosis: Probably secondary to sickle cell crisis.  Continue treatment  #3 tobacco abuse: Continue nicotine patch.  #4 normocytic anemia: Stable H&H.  Continue to monitor   LOS: 5 days   Ronald Hurst,LAWAL 10/19/2020, 2:32 AM

## 2020-10-20 MED ORDER — OXYCODONE HCL 5 MG PO TABS: 5.0000 mg | ORAL_TABLET | Freq: Four times a day (QID) | ORAL | 0 refills | Status: DC | PRN

## 2020-10-20 NOTE — Discharge Summary (Signed)
Physician Discharge Summary  Patient ID: Ronald Hurst MRN: [id number] DOB/AGE: Mar 20, 1976 45 y.o.  Admit date: 10/13/2020 Discharge date: 10/20/2020  Admission Diagnoses:  Discharge Diagnoses:  Principal Problem:   Sickle cell pain crisis ([place]) Active Problems:   Tobacco dependence   Leukocytosis   Essential hypertension   Abnormal CT scan   Hemangioma of liver   Discharged Condition: good  Hospital Course: Patient admitted with acute sickle cell crisis with known history of essential hypertension and tobacco abuse.  Patient was treated with IV Dilaudid PCA, Toradol as well as supportive care.  He did very well.  Also placed on his oral medications.  Ultimately did better and was discharged home on oral therapy.  Patient was given his prescription to take at home.  Blood pressure was controlled using his home regimen.  Patient to follow with PCP  Consults: None  Significant Diagnostic Studies: labs: CBC and CMP checked no distress  Treatments: IV hydration and analgesia: Dilaudid  Discharge Exam: Blood pressure (!) 141/104, pulse (!) 107, temperature 98.7 F (37.1 C), temperature source Oral, resp. rate 14, height 5\' 9"  (1.753 m), weight 73.9 kg, SpO2 99 %. General appearance: alert, cooperative, appears stated age and no distress Head: Normocephalic, without obvious abnormality, atraumatic Resp: clear to auscultation bilaterally Cardio: regular rate and rhythm, S1, S2 normal, no murmur, click, rub or gallop GI: soft, non-tender; bowel sounds normal; no masses,  no organomegaly Extremities: extremities normal, atraumatic, no cyanosis or edema Pulses: 2+ and symmetric Skin: Skin color, texture, turgor normal. No rashes or lesions Neurologic: Grossly normal  Disposition: Discharge disposition: 01-Home or Self Care        Allergies as of 10/20/2020   No Known Allergies     Medication List    TAKE these medications   amLODipine 10 MG tablet Commonly known  as: NORVASC Take 1 tablet (10 mg total) by mouth daily.   folic acid 1 MG tablet Commonly known as: FOLVITE Take 1 tablet (1 mg total) by mouth daily.   ibuprofen 600 MG tablet Commonly known as: ADVIL Take 1 tablet (600 mg total) by mouth every 6 (six) hours as needed. What changed: reasons to take this   oxyCODONE 5 MG immediate release tablet Commonly known as: Oxy IR/ROXICODONE Take 1 tablet (5 mg total) by mouth every 6 (six) hours as needed for up to 15 days for severe pain.   Vitamin D (Ergocalciferol) 1.25 MG (50000 UNIT) Caps capsule Commonly known as: DRISDOL Take 1 capsule (50,000 Units total) by mouth every 7 (seven) days.        SignedBarbette Merino 10/20/2020, 1:48 PM   Time spent 34 minutes

## 2020-10-20 NOTE — Progress Notes (Signed)
Discharge instructions explained to patient and his girlfriend. Letter given to [facility] for when he can return to work. He continues to complain of pain in both his legs, states they are achy. Ambulating in the room. Prescription given to patient. IV dc'd. The patient was discharged via wheelchair to his girlfriend.

## 2020-10-20 NOTE — Discharge Instructions (Signed)
Footstrike Hemolysis Footstrike hemolysis happens when red blood cells in the foot break down faster than usual. This is usually due to repeated contact between the foot and the ground. This condition often happens to long-distance runners. It can also happen to other athletes, including dancers and hikers. Footstrike hemolysis may also be called march hemoglobinuria. This condition may cause part of the red blood cells (hemoglobin) to appear in the urine, making the urine red (hematuria). In most cases, the body can make enough new red blood cells to replace the damaged cells. If the body cannot replace red blood cells quickly enough, the blood will not carry enough oxygen throughout the body (anemia). This can cause fatigue and weakness. What are the causes? Footstrike hemolysis is caused by one or both of the following factors:  Repeated force to the bottom of the foot.  Increased pressure in the foot muscles. What increases the risk? You may be more likely to develop this condition if you:  Run long distances.  Run on hard surfaces.  Wear shoes that are not supportive or are worn out.  Lack iron in your diet.  Are overweight.  Exercise for a long time.  Exercise at a high intensity.  Exercise in high temperatures. What are the signs or symptoms? In some cases, footstrike hemolysis may not cause any symptoms. If you do have symptoms, they may include:  Fatigue and weakness.  Shortness of breath and dizziness.  Rapid heartbeat.  Hematuria.  Reduced athletic performance or endurance. Endurance is the ability to use your muscles for a long time, even after they get tired. How is this diagnosed? This condition may be diagnosed based on your symptoms, your medical history, and a physical exam. You may also have blood tests done. How is this treated? This condition may be treated by modifying your physical activity in order to place less stress on your feet. Your health care  provider may recommend that you take iron supplements. If you are overweight, your health care provider may recommend that you lose weight. Follow these instructions at home: Activity  Do not make sudden increases in the frequency or intensity of your physical activity.  Wear supportive shoes that are in good condition. Use shock-absorbing insoles to reduce the impact on your feet.  When possible, do physical activity on soft surfaces and in cool temperatures.  Return to your normal activities as told by your health care provider. Ask your health care provider what activities are safe for you.   General instructions  Take over-the-counter and prescription medicines only as told by your health care provider.  Take supplements only as recommended by your health care provider.  If you had blood tests done, it is up to you to get your test results. Ask your health care provider, or the department that is doing the test, when your results will be ready.  Maintain a healthy weight.  Keep all follow-up visits as told by your health care provider. This is important.   How is this prevented?  Wear comfortable and supportive footwear when participating in physical activity.  Give your body time to rest between periods of activity. Contact a health care provider if:  You have unexplained fatigue that interferes with your ability to exercise or to do your normal activities.  Your urine is red or bloody.  Your symptoms get worse. Summary  Footstrike hemolysis happens when red blood cells in the foot break down faster than usual. This is usually due to  repeated contact between the foot and the ground.  This condition often happens to long-distance runners, but it can also affect other athletes, such as dancers or hikers.  Footstrike hemolysis may be treated by reducing physical activity, taking iron supplements, and losing weight if necessary.  Make sure you wear supportive shoes that are  in good condition. Use shock-absorbing insoles to reduce the impact on your feet. This information is not intended to replace advice given to you by your health care provider. Make sure you discuss any questions you have with your health care provider. Document Revised: 12/29/2018 Document Reviewed: 08/15/2018 Elsevier Patient Education  2021 [employer]   Liver Cancer  Liver cancer is an abnormal growth of cells (tumor) in the liver. The liver is located on the upper right side of the abdomen, just below the ribs. What are the causes? The exact cause of this condition is not known. What increases the risk? You are more likely to develop this condition if you:  Are a male.  Have scarring of the liver (cirrhosis). Cirrhosis may be caused by: ? Too much alcohol use. ? Hepatitis B or hepatitis C. ? Smoking.  Have diabetes.  Have a condition in which the body stores too much iron (hemochromatosis).  Have a buildup of fat in the liver without alcohol use (nonalcoholic fatty liver disease).  Are exposed to aflatoxins. These are substances made by certain types of mold that grow on food products, such as corn and peanuts.  Use drugs that build muscles (anabolic steroids) or medicines that prevent pregnancy (oral contraceptives).  Have a disease caused by parasitic flatworms (schistosomiasis).  Are obese. What are the signs or symptoms? In some cases, there are few or no symptoms in the beginning. As the disease progresses, symptoms may include:  Weight loss.  Loss of appetite.  Nausea or vomiting.  Feeling itchy.  Abnormal bruising or bleeding.  Feeling very weak and tired.  Pain on the right side of your abdomen, shoulder, or back.  Skin or eyes that look yellow in color (jaundice).  Dark-colored urine.  White, chalk-like stools. Liver cancer may cause other medical conditions to develop, such as:  Hypercalcemia. This is high calcium levels in the blood, which  may cause weakness, constipation, or confusion.  Hypoglycemia. This is low blood sugar, which may cause fatigue, confusion, and sweating.  Enlarged breasts in men (gynecomastia).  Small testicles in men.  Looking and feeling flushed due to a high amount of red blood cells in the body.  High levels of fat in the blood (cholesterol). How is this diagnosed? This condition is diagnosed with a medical history and physical exam. You may also have tests, including:  Blood tests.  Imaging tests, such as: ? CT scan. ? MRI. ? Ultrasound. ? Bone scan.  Laparoscopy. A small, lighted camera (laparoscope) is used to look at your liver and other organs.  Biopsy. Samples of tissue from the liver are taken and tested in a lab. If liver cancer is confirmed, it will be staged to determine its severity and extent. Staging checks:  The size of the tumor.  If the cancer has spread.  Where the cancer has spread. How is this treated? Treatment for this condition depends on the type and stage of the cancer, and your overall health. Treatment may include:  Surgery to remove the cancer cells. Sometimes the entire liver is removed and replaced with a healthy liver (liver transplant).  Chemotherapy. Medicines are used to kill  the cancer cells.  Targeted therapy. This targets specific parts of cancer cells and nearby areas to block the growth and spread of the cancer.  Immunotherapy, also called biological therapy. This strengthens your body's defense (immune) system to destroy or stop the growth and spread of cancer cells.  Radiation therapy. This uses high-energy X-rays to kill the cancer cells.  Ablation. This destroys the tumor cells using high-energy radio waves, cold therapy, heat therapy, or a type of alcohol (ethanol).  Embolization. This is a procedure that blocks the blood flow to the tumor in the liver.  Chemoembolization. This combines embolization with tiny beads that deliver  chemotherapy directly to the site of the tumor.  Radioembolization. This combines embolization with tiny beads that deliver radiation directly to the site of the tumor. Follow these instructions at home: Medicines  Take over-the-counter and prescription medicines only as told by your health care provider.  Ask your health care provider about: ? Changing or stopping your regular medicines. This is especially important if you are taking diabetes medicines or blood thinners. ? Taking medicines such as aspirin and ibuprofen. These medicines can thin your blood. Do not take these medicines unless your health care provider tells you to take them. ? Taking over-the-counter medicines, vitamins, herbs, and supplements. Lifestyle  Do not drink alcohol.  Do not use any products that contain nicotine or tobacco, such as cigarettes and e-cigarettes. If you need help quitting, ask your health care provider.  Consider joining a cancer support group. Ask your health care provider for more information about local and online support groups. This may help you learn to cope with the stress of having liver cancer.   General instructions  Talk to your health care provider about the side-effects of treatment and the best way to manage them.  Keep all follow-up visits as told by your health care provider. This is important. Where to find more information  American Cancer Society: www.cancer.[place]: www.cancer.gov Contact a health care provider if:  You cannot eat because you feel nauseous or are vomiting.  You feel weaker or more tired than usual.  Your pain gets worse.  You feel depressed or anxious. Get help right away if:  You feel confused.  Pain in your abdomen increases suddenly.  Your abdomen or legs start to swell.  You have a fever, chills, or body aches.  You notice unusual bleeding or bleeding that does not stop quickly.  You have maroon, black, or bloody  stools.  Your eyes or skin become more yellow in color. These symptoms may represent a serious problem that is an emergency. Do not wait to see if the symptoms will go away. Get medical help right away. Call your local emergency services (911 in the [place] Do not drive yourself to the hospital. Summary  Liver cancer is an abnormal growth of cells (tumor) in the liver that is cancerous (malignant).  Treatment for liver cancer depends on the type and stage of the cancer, and your overall health.  Talk to your health care provider about the side-effects of treatment and the best way to manage them.  Keep all follow-up visits as told by your health care provider. This is important. This information is not intended to replace advice given to you by your health care provider. Make sure you discuss any questions you have with your health care provider. Document Revised: 06/07/2018 Document Reviewed: 06/07/2018 Elsevier Patient Education  2021 [place].   Sickle Cell  Anemia, Adult  Sickle cell anemia is a condition where your red blood cells are shaped like sickles. Red blood cells carry oxygen through the body. Sickle-shaped cells do not live as long as normal red blood cells. They also clump together and block blood from flowing through the blood vessels. This prevents the body from getting enough oxygen. Sickle cell anemia causes organ damage and pain. It also increases the risk of infection. Follow these instructions at home: Medicines  Take over-the-counter and prescription medicines only as told by your doctor.  If you were prescribed an antibiotic medicine, take it as told by your doctor. Do not stop taking the antibiotic even if you start to feel better.  If you develop a fever, do not take medicines to lower the fever right away. Tell your doctor about the fever. Managing pain, stiffness, and swelling  Try these methods to help with pain: ? Use a heating pad. ? Take a warm  bath. ? Distract yourself, such as by watching TV. Eating and drinking  Drink enough fluid to keep your pee (urine) clear or pale yellow. Drink more in hot weather and during exercise.  Limit or avoid alcohol.  Eat a healthy diet. Eat plenty of fruits, vegetables, whole grains, and lean protein.  Take vitamins and supplements as told by your doctor. Traveling  When traveling, keep these with you: ? Your medical information. ? The names of your doctors. ? Your medicines.  If you need to take an airplane, talk to your doctor first. Activity  Rest often.  Avoid exercises that make your heart beat much faster, such as jogging. General instructions  Do not use products that have nicotine or tobacco, such as cigarettes and e-cigarettes. If you need help quitting, ask your doctor.  Consider wearing a medical alert bracelet.  Avoid being in high places (high altitudes), such as mountains.  Avoid very hot or cold temperatures.  Avoid places where the temperature changes a lot.  Keep all follow-up visits as told by your doctor. This is important. Contact a doctor if:  A joint hurts.  Your feet or hands hurt or swell.  You feel tired (fatigued). Get help right away if:  You have symptoms of infection. These include: ? Fever. ? Chills. ? Being very tired. ? Irritability. ? Poor eating. ? Throwing up (vomiting).  You feel dizzy or faint.  You have new stomach pain, especially on the left side.  You have a an erection (priapism) that lasts more than 4 hours.  You have numbness in your arms or legs.  You have a hard time moving your arms or legs.  You have trouble talking.  You have pain that does not go away when you take medicine.  You are short of breath.  You are breathing fast.  You have a long-term cough.  You have pain in your chest.  You have a bad headache.  You have a stiff neck.  Your stomach looks bloated even though you did not eat  much.  Your skin is pale.  You suddenly cannot see well. Summary  Sickle cell anemia is a condition where your red blood cells are shaped like sickles.  Follow your doctor's advice on ways to manage pain, food to eat, activities to do, and steps to take for safe travel.  Get medical help right away if you have any signs of infection, such as a fever. This information is not intended to replace advice given to you by your  health care provider. Make sure you discuss any questions you have with your health care provider. Document Revised: 02/01/2020 Document Reviewed: 02/01/2020 Elsevier Patient Education  2021 [place].   Hemolytic Anemia Anemia is a condition in which you do not have enough red blood cells to carry oxygen throughout your body. Hemolytic anemia happens when your red blood cells are destroyed faster than they are made. There are many types of hemolytic anemia, and each type is either inherited or acquired:  Inherited. These are due to a gene that your parents passed on to you. The abnormal cells may break down while moving through the body system that includes your heart, blood, lymph fluid, and blood vessels (circulatory system). Your spleen may remove the abnormal blood cells and traces of destroyed blood cells (debris) from your bloodstream.  Acquired. These happen when your red blood cells are destroyed either by certain medicines that you have used or as a result of infections or medical conditions that you have. What are the causes? The destruction of red blood cells in your body causes this condition. Sometimes the cause is not known. Known causes include:  Inherited disorders, such as sickle cell anemia and hemoglobin disorder (thalassemia).  Use of certain medicines.  Blood infection (septicemia).  Exposure to poisonous (toxic) chemicals or excessive radiation.  Reactions to blood transfusions.  Certain immune disorders.  Artificial heart  valves.  Enlarged spleen.  Hemodialysis. This is a treatment for kidney failure.  Cancer. What are the signs or symptoms? Symptoms of this condition include:  Pale skin, eyes, and fingernails.  Irregular or fast heartbeat.  Headaches.  Tiredness (fatigue) and weakness.  Dizziness or fainting.  Shortness of breath.  Yellowing of the skin or the white parts of the eyes (jaundice).  Chest pain.  Cold hands and feet.  Problems with memory and thinking. How is this diagnosed? This condition is diagnosed based on a physical exam and your symptoms. You may also have tests, including:  Blood tests. You may have a complete blood count (CBC).  Urine tests.  Bone marrow tissue exam (biopsy).   How is this treated? Treatment depends on the cause of your condition. Treatment may include:  Medicines.  Blood transfusions.  Purification of blood to remove antibodies (plasmapheresis).  Blood and bone marrow stem cell transplant.  Surgery to remove the spleen.  Diet changes and supplements. You may need to take folic acid and iron. Follow these instructions at home:  Take over-the-counter and prescription medicines only as told by your health care provider. These include any iron or other supplements.  If you were prescribed an antibiotic medicine, take it as told by your health care provider. Do not stop taking the antibiotic even if you start to feel better.  Prevent infection and lower your risk of getting sick by: ? Avoiding people who are sick. ? Getting a flu shot and pneumonia shot, if told by your health care provider. ? Not eating or drinking foods that are more likely to have bacteria in them, such as raw or uncooked fish or meat. ? Washing your hands often with soap and water. If soap and water are not available, use hand sanitizer.  Keep all follow-up visits as told by your health care provider. This is important. Contact a health care provider if:  You  become dizzy or tired easily.  Your skin looks pale.  You feel your heart beating faster than normal.  You feel like your heart has skipped beats  or stopped beating (irregular heartbeat). Get help right away if:  Your skin and the white parts of your eyes turn yellow.  You develop chest pain.  You become short of breath.  You faint.  You develop an uncontrolled cough. Summary  Anemia is a condition in which you do not have enough red blood cells to carry oxygen throughout your body.  Hemolytic anemia happens when your red blood cells are destroyed faster than they are made.  Treatment depends on the cause of your condition.  Take actions to prevent infection and lower your risk of getting sick, such as avoiding people who are sick, getting shots as directed, avoiding raw or uncooked foods, and washing your hands often with soap and water. This information is not intended to replace advice given to you by your health care provider. Make sure you discuss any questions you have with your health care provider. Document Revised: 01/29/2020 Document Reviewed: 01/29/2020 Elsevier Patient Education  2021 [place].   Metastatic Cancer  Metastatic cancer is cancer that has spread from the place where it started (primary site) to another part of the body. The process of cancer spreading from the primary site is called metastasis. When cancer cells metastasize, they do not change the way they look or the way they affect the body. An example is when primary lung cancer spreads to the brain. This is called metastatic lung cancer, not brain cancer. All types of cancer can spread. Some cancers are more likely to metastasize than others. The most common places that cancers metastasize to are:  Bones.  Liver.  Lungs. What are the causes? Metastasis occurs when cancer cells spread from the primary site to another part of the body. Cancer cells can spread:  Directly from one part of the  body to a nearby area (local invasion).  Into a lymph vessel. Cancer cells can be carried through the lymph system to lymph nodes and other parts of the body. The lymph system is a network of vessels and nodes that help protect against infections.  Into the blood vessels. Cancer cells can be carried to other parts of the body through the bloodstream. What increases the risk? The following factors may make you more likely to develop this condition:  The type of cancer that you have.  The stage and grade of your primary cancer at the time of diagnosis.  The grade and stage of the tumor. Grading and staging predict how quickly cancer cells will grow and their chances of metastasis.  A large primary tumor.  A higher grade of tumor.  Deeper growth of tumor.  A tumor that has entered the lymph system. What are the signs or symptoms? Symptoms of this condition include:  Weakness.  Lack of energy.  Pain.  Weight loss.  Trouble breathing.  Fluid buildup in your lungs or abdomen.  Tumor growths that can be felt or seen.  An enlarged liver. Some people with this condition may have no symptoms. How is this diagnosed? This condition may be diagnosed based on:  Your symptoms.  Physical exam. This may include: ? Blood tests to check for certain substances that are secreted by tumors (tumor markers).  Tumor markers that increase after treatment can indicate metastasis.  Tumor markers may be used to help diagnose metastasis in some cancers, such as colon and prostate cancer.  Not all cancers have tumor markers. ? Imaging studies, such as:  X-rays.  Ultrasound.  MRI.  Other imaging tests, such  as CT scans, bone scans, and PET scans. ? Testing tissue that is removed from the new cancer site (biopsy). If the cells are similar to cancer cells from the primary site, this can confirm metastatic cancer. ? Testing fluid samples from the lungs, spine, or abdomen for metastatic  cancer cells. How is this treated? There are many options for treating metastatic cancer. Your treatment will depend on:  The type of cancer you have.  How far your cancer has advanced.  Your general health. Treatment may not be able to cure metastatic cancer, but it can often relieve the symptoms. In many cases, you may have a combination of treatments. Options may include:  Surgery.  Medicines that kill cancer cells (chemotherapy).  High-energy rays that kill cancer cells (radiation therapy).  Targeted therapy. This targets specific parts of cancer cells and the area around them to block the growth and the spread of the cancer. Targeted therapy can help to limit the damage to healthy cells.  Hormone therapy.  Treatments that help your body fight cancer (biologic therapy).  Medicines that help your body's disease-fighting system (immune system) fight cancer cells (immunotherapy).  Freezing cancer cells using gas or liquid that is delivered through a needle (cryoablation).  Destroying cancer cells using high-energy radio waves that are delivered through a needle-like probe (radiofrequency ablation).  A procedure to block the artery that supplies blood to the tumor, which kills the cancer cells (embolization).  Other medicines to manage symptoms related to cancer or cancer treatments. Follow these instructions at home: Eating and drinking  Some of your treatments might affect your appetite and your ability to chew and swallow. If you are having problems eating, or if you do not have an appetite, meet with a diet and nutrition specialist (dietitian).  If you have side effects that affect eating, it may help to: ? Eat smaller meals and snacks often. ? Drink high-nutrition and high-calorie shakes or supplements. ? Eat bland and soft foods that are easy to eat. ? Avoid foods that are hot, spicy, or hard to swallow. Lifestyle  Do not drink alcohol.  Do not use any products  that contain nicotine or tobacco, such as cigarettes and e-cigarettes. If you need help quitting, ask your health care provider.   General instructions  Take over-the-counter and prescription medicines only as told by your health care provider. This includes vitamins, supplements, and herbal products.  Work with your health care provider to manage any side effects of treatment.  Keep all follow-up visits as told by your health care provider. This is important. Where to find more information  American Cancer Society: www.cancer.[place] ([place]): www.cancer.gov Contact a health care provider if you:  Notice that you bruise or bleed easily.  Are losing weight without trying.  Have new or increased fatigue or weakness. Get help right away if you have:  A seizure.  A sudden increase in pain.  A fever.  Shortness of breath.  Chest pain. Summary  Metastatic cancer is cancer that has spread from the place where it started (primary site) to another part of the body.  Cancer cells can spread directly from one part of the body to a nearby area, or they may spread through the lymph system or the bloodstream.  Your risk for metastatic cancer depends on the type of cancer you have and the stage and grade of your primary cancer.  Treatment may not be able to cure metastatic cancer, but it can  often relieve the symptoms. This information is not intended to replace advice given to you by your health care provider. Make sure you discuss any questions you have with your health care provider. Document Revised: 10/31/2018 Document Reviewed: 09/22/2017 Elsevier Patient Education  2021 [employer].

## 2020-10-28 ENCOUNTER — Encounter: Payer: Self-pay | Admitting: Nurse Practitioner

## 2020-10-28 ENCOUNTER — Ambulatory Visit (INDEPENDENT_AMBULATORY_CARE_PROVIDER_SITE_OTHER): Payer: Self-pay | Admitting: Nurse Practitioner

## 2020-10-28 ENCOUNTER — Other Ambulatory Visit: Payer: Self-pay

## 2020-10-28 VITALS — BP 136/87 | HR 97 | Temp 98.8°F | Ht 69.0 in | Wt 158.0 lb

## 2020-10-28 DIAGNOSIS — I10 Essential (primary) hypertension: Secondary | ICD-10-CM

## 2020-10-28 DIAGNOSIS — F17209 Nicotine dependence, unspecified, with unspecified nicotine-induced disorders: Secondary | ICD-10-CM

## 2020-10-28 DIAGNOSIS — G894 Chronic pain syndrome: Secondary | ICD-10-CM

## 2020-10-28 DIAGNOSIS — D571 Sickle-cell disease without crisis: Secondary | ICD-10-CM

## 2020-10-28 MED ORDER — OXYCODONE HCL 10 MG PO TABS: 10.0000 mg | ORAL_TABLET | Freq: Four times a day (QID) | ORAL | 0 refills | Status: DC | PRN

## 2020-10-28 MED ORDER — IBUPROFEN 800 MG PO TABS: 800.0000 mg | ORAL_TABLET | Freq: Three times a day (TID) | ORAL | 0 refills | Status: AC | PRN

## 2020-10-28 MED ORDER — FOLIC ACID 1 MG PO TABS: 1.0000 mg | ORAL_TABLET | Freq: Every day | ORAL | 3 refills | Status: AC
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
## 2020-10-29 ENCOUNTER — Telehealth: Payer: Self-pay | Admitting: Family Medicine

## 2020-10-29 LAB — CBC WITH DIFFERENTIAL/PLATELET
Basophils Absolute: 0 10*3/uL (ref 0.0–0.2)
Basos: 0 %
EOS (ABSOLUTE): 0.2 10*3/uL (ref 0.0–0.4)
Eos: 2 %
Hematocrit: 28.3 % — ABNORMAL LOW (ref 37.5–51.0)
Hemoglobin: 9.1 g/dL — ABNORMAL LOW (ref 13.0–17.7)
Immature Grans (Abs): 0.1 10*3/uL (ref 0.0–0.1)
Immature Granulocytes: 1 %
Lymphocytes Absolute: 2.9 10*3/uL (ref 0.7–3.1)
Lymphs: 29 %
MCH: 26.1 pg — ABNORMAL LOW (ref 26.6–33.0)
MCHC: 32.2 g/dL (ref 31.5–35.7)
MCV: 81 fL (ref 79–97)
Monocytes Absolute: 0.8 10*3/uL (ref 0.1–0.9)
Monocytes: 8 %
NRBC: 1 % — ABNORMAL HIGH (ref 0–0)
Neutrophils Absolute: 6.1 10*3/uL (ref 1.4–7.0)
Neutrophils: 60 %
Platelets: 1012 10*3/uL (ref 150–450)
RBC: 3.49 x10E6/uL — ABNORMAL LOW (ref 4.14–5.80)
RDW: 17.5 % — ABNORMAL HIGH (ref 11.6–15.4)
WBC: 10 10*3/uL (ref 3.4–10.8)

## 2020-10-29 LAB — COMP. METABOLIC PANEL (12)
AST: 25 IU/L (ref 0–40)
Albumin/Globulin Ratio: 1.4 (ref 1.2–2.2)
Albumin: 4.2 g/dL (ref 4.0–5.0)
Alkaline Phosphatase: 131 IU/L — ABNORMAL HIGH (ref 44–121)
BUN/Creatinine Ratio: 7 — ABNORMAL LOW (ref 9–20)
BUN: 9 mg/dL (ref 6–24)
Bilirubin Total: 0.4 mg/dL (ref 0.0–1.2)
Calcium: 9.6 mg/dL (ref 8.7–10.2)
Chloride: 102 mmol/L (ref 96–106)
Creatinine, Ser: 1.25 mg/dL (ref 0.76–1.27)
GFR calc Af Amer: 80 mL/min/{1.73_m2} (ref >59)
GFR calc non Af Amer: 69 mL/min/{1.73_m2} (ref >59)
Globulin, Total: 3 g/dL (ref 1.5–4.5)
Glucose: 82 mg/dL (ref 65–99)
Potassium: 4.7 mmol/L (ref 3.5–5.2)
Sodium: 141 mmol/L (ref 134–144)
Total Protein: 7.2 g/dL (ref 6.0–8.5)

## 2020-10-29 NOTE — Telephone Encounter (Signed)
Attempted to contact patient today review recent labs which revealed increased platelet count. Unable to leave message on voicemail. Spoke with patient's Aunt and she will inform patient to contact our office to review labs.

## 2020-10-30 ENCOUNTER — Other Ambulatory Visit: Payer: Self-pay | Admitting: Nurse Practitioner

## 2020-10-30 DIAGNOSIS — R7989 Other specified abnormal findings of blood chemistry: Secondary | ICD-10-CM

## 2020-10-30 NOTE — Progress Notes (Signed)
   [address] [place], [place]  [address] Phone:  [phone]   Fax:  [phone]  Elevated platelets 1,012. Patient to repeat labs in the next day or 2 due to his work schedule.

## 2020-11-07 ENCOUNTER — Telehealth: Payer: Self-pay

## 2020-11-07 NOTE — Telephone Encounter (Signed)
-----   Message from Vevelyn Francois, NP sent at 11/06/2020  1:38 PM EST ----- MyChart message sent to the patient. However additional follow up maybe needed. Thanks

## 2020-11-07 NOTE — Telephone Encounter (Signed)
Patient aware of results and recommendations. °

## 2020-11-25 ENCOUNTER — Other Ambulatory Visit: Payer: Self-pay

## 2020-11-25 ENCOUNTER — Ambulatory Visit (INDEPENDENT_AMBULATORY_CARE_PROVIDER_SITE_OTHER): Payer: Self-pay | Admitting: Nurse Practitioner

## 2020-11-25 ENCOUNTER — Encounter: Payer: Self-pay | Admitting: Nurse Practitioner

## 2020-11-25 VITALS — BP 133/93 | HR 85 | Ht 69.0 in | Wt 155.0 lb

## 2020-11-25 DIAGNOSIS — R7989 Other specified abnormal findings of blood chemistry: Secondary | ICD-10-CM

## 2020-11-25 DIAGNOSIS — I2699 Other pulmonary embolism without acute cor pulmonale: Secondary | ICD-10-CM

## 2020-11-25 DIAGNOSIS — D571 Sickle-cell disease without crisis: Secondary | ICD-10-CM

## 2020-11-25 DIAGNOSIS — I10 Essential (primary) hypertension: Secondary | ICD-10-CM

## 2020-11-25 LAB — POCT URINALYSIS DIPSTICK
Bilirubin, UA: NEGATIVE
Blood, UA: NEGATIVE
Glucose, UA: POSITIVE — AB
Ketones, UA: NEGATIVE
Leukocytes, UA: NEGATIVE
Nitrite, UA: NEGATIVE
Protein, UA: NEGATIVE
Spec Grav, UA: 1.025 (ref 1.010–1.025)
Urobilinogen, UA: 0.2 E.U./dL
pH, UA: 5 (ref 5.0–8.0)

## 2020-11-25 MED ORDER — AMLODIPINE BESYLATE 10 MG PO TABS: 10.0000 mg | ORAL_TABLET | Freq: Every day | ORAL | 11 refills | Status: DC

## 2020-11-25 MED ORDER — CLONIDINE HCL 0.1 MG PO TABS
0.2000 mg | ORAL_TABLET | Freq: Once | ORAL | Status: AC
  Administered 2020-11-25: 0.2 mg via ORAL

## 2020-11-25 MED ORDER — OXYCODONE HCL 10 MG PO TABS: 10.0000 mg | ORAL_TABLET | Freq: Four times a day (QID) | ORAL | 0 refills | Status: DC | PRN

## 2020-11-25 NOTE — Patient Instructions (Addendum)
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
## 2020-11-26 ENCOUNTER — Ambulatory Visit: Payer: No Typology Code available for payment source | Attending: Internal Medicine

## 2020-11-26 DIAGNOSIS — Z23 Encounter for immunization: Secondary | ICD-10-CM

## 2020-11-26 LAB — CBC WITH DIFFERENTIAL/PLATELET
Basophils Absolute: 0.1 10*3/uL (ref 0.0–0.2)
Basos: 1 %
EOS (ABSOLUTE): 0.2 10*3/uL (ref 0.0–0.4)
Eos: 2 %
Hematocrit: 33.7 % — ABNORMAL LOW (ref 37.5–51.0)
Hemoglobin: 11.1 g/dL — ABNORMAL LOW (ref 13.0–17.7)
Immature Grans (Abs): 0 10*3/uL (ref 0.0–0.1)
Immature Granulocytes: 0 %
Lymphocytes Absolute: 2.7 10*3/uL (ref 0.7–3.1)
Lymphs: 31 %
MCH: 27.3 pg (ref 26.6–33.0)
MCHC: 32.9 g/dL (ref 31.5–35.7)
MCV: 83 fL (ref 79–97)
Monocytes Absolute: 0.8 10*3/uL (ref 0.1–0.9)
Monocytes: 9 %
Neutrophils Absolute: 5 10*3/uL (ref 1.4–7.0)
Neutrophils: 57 %
Platelets: 428 10*3/uL (ref 150–450)
RBC: 4.06 x10E6/uL — ABNORMAL LOW (ref 4.14–5.80)
RDW: 16.3 % — ABNORMAL HIGH (ref 11.6–15.4)
WBC: 8.7 10*3/uL (ref 3.4–10.8)

## 2020-11-26 NOTE — Progress Notes (Signed)
   Covid-19 Vaccination Clinic  Name:  Ronald Hurst    MRN: [id number] DOB: 05-29-1976  11/26/2020  [name] was observed post Covid-19 immunization for 15 minutes without incident. He was provided with Vaccine Information Sheet and instruction to access the V-Safe system.   [name] was instructed to call 911 with any severe reactions post vaccine: Marland Kitchen Difficulty breathing  . Swelling of face and throat  . A fast heartbeat  . A bad rash all over body  . Dizziness and weakness   Immunizations Administered    Name Date Dose VIS Date Route   PFIZER Comrnaty(Gray TOP) Covid-19 Vaccine 11/26/2020 10:22 AM 0.3 mL 08/29/2020 Intramuscular   Manufacturer: [employer], [employer]   Lot: [id number]   [place]: [phone]

## 2020-12-02 ENCOUNTER — Ambulatory Visit: Payer: Medicaid Other | Admitting: Nurse Practitioner

## 2020-12-12 ENCOUNTER — Other Ambulatory Visit: Payer: Self-pay

## 2020-12-13 ENCOUNTER — Other Ambulatory Visit: Payer: Self-pay | Admitting: Nurse Practitioner

## 2020-12-13 ENCOUNTER — Telehealth: Payer: Self-pay

## 2020-12-13 MED ORDER — IBUPROFEN 800 MG PO TABS: 800.0000 mg | ORAL_TABLET | ORAL | 11 refills | Status: DC | PRN

## 2020-12-13 MED ORDER — OXYCODONE HCL 10 MG PO TABS: 10.0000 mg | ORAL_TABLET | Freq: Four times a day (QID) | ORAL | 0 refills | Status: DC | PRN

## 2020-12-13 NOTE — Telephone Encounter (Signed)
sent 

## 2020-12-13 NOTE — Telephone Encounter (Signed)
Need All medicines to be refilled

## 2020-12-16 ENCOUNTER — Telehealth: Payer: Self-pay | Admitting: Nurse Practitioner

## 2020-12-16 NOTE — Telephone Encounter (Signed)
I have sent all his medication weeks ago and his pain medication was sent when requested. He needs to have the pharmacy contact us if there is additional concerns. Thanks

## 2020-12-17 ENCOUNTER — Ambulatory Visit: Payer: No Typology Code available for payment source | Attending: Internal Medicine

## 2020-12-17 ENCOUNTER — Other Ambulatory Visit (HOSPITAL_COMMUNITY): Payer: Self-pay | Admitting: Internal Medicine

## 2020-12-17 DIAGNOSIS — Z23 Encounter for immunization: Secondary | ICD-10-CM

## 2020-12-17 MED FILL — PFIZER-BIONT COVID-19 VAC-T: 30 | 1 days supply | Qty: 0 | Fill #0

## 2020-12-17 NOTE — Progress Notes (Signed)
   Covid-19 Vaccination Clinic  Name:  Ronald Hurst    MRN: [id number] DOB: 28-Mar-1976  12/17/2020  Mr. Heatwole was observed post Covid-19 immunization for 15 minutes without incident. He was provided with Vaccine Information Sheet and instruction to access the V-Safe system.   Mr. Dooly was instructed to call 911 with any severe reactions post vaccine: Marland Kitchen Difficulty breathing  . Swelling of face and throat  . A fast heartbeat  . A bad rash all over body  . Dizziness and weakness   Immunizations Administered    Name Date Dose VIS Date Route   PFIZER Comrnaty(Gray TOP) Covid-19 Vaccine 12/17/2020 11:21 AM 0.3 mL 08/29/2020 Intramuscular   Manufacturer: [employer], [employer]   Lot: [id number]   [place]: [phone]

## 2020-12-26 ENCOUNTER — Ambulatory Visit (INDEPENDENT_AMBULATORY_CARE_PROVIDER_SITE_OTHER): Payer: Self-pay | Admitting: Nurse Practitioner

## 2020-12-26 ENCOUNTER — Encounter: Payer: Self-pay | Admitting: Nurse Practitioner

## 2020-12-26 ENCOUNTER — Other Ambulatory Visit: Payer: Self-pay

## 2020-12-26 VITALS — BP 128/93 | HR 82 | Temp 98.1°F | Ht 69.0 in | Wt 153.0 lb

## 2020-12-26 DIAGNOSIS — G4700 Insomnia, unspecified: Secondary | ICD-10-CM

## 2020-12-26 DIAGNOSIS — I10 Essential (primary) hypertension: Secondary | ICD-10-CM

## 2020-12-26 DIAGNOSIS — E559 Vitamin D deficiency, unspecified: Secondary | ICD-10-CM

## 2020-12-26 MED ORDER — VITAMIN D (ERGOCALCIFEROL) 1.25 MG (50000 UNIT) PO CAPS
50000.0000 [IU] | ORAL_CAPSULE | ORAL | 3 refills | Status: DC
Start: 2020-12-26 — End: 2021-12-15

## 2020-12-26 MED ORDER — MIRTAZAPINE 15 MG PO TABS: 15.0000 mg | ORAL_TABLET | Freq: Every day | ORAL | 11 refills | Status: AC

## 2020-12-26 MED ORDER — OXYCODONE HCL 10 MG PO TABS: 10.0000 mg | ORAL_TABLET | Freq: Four times a day (QID) | ORAL | 0 refills | Status: DC | PRN

## 2020-12-26 NOTE — Patient Instructions (Signed)
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
## 2021-01-24 ENCOUNTER — Other Ambulatory Visit: Payer: Self-pay

## 2021-01-24 ENCOUNTER — Telehealth (INDEPENDENT_AMBULATORY_CARE_PROVIDER_SITE_OTHER): Payer: Self-pay | Admitting: Nurse Practitioner

## 2021-01-24 ENCOUNTER — Encounter: Payer: Self-pay | Admitting: Nurse Practitioner

## 2021-01-24 VITALS — BP 130/87 | HR 87 | Temp 98.5°F | Ht 69.0 in | Wt 157.0 lb

## 2021-01-24 DIAGNOSIS — G4700 Insomnia, unspecified: Secondary | ICD-10-CM

## 2021-01-24 DIAGNOSIS — I10 Essential (primary) hypertension: Secondary | ICD-10-CM

## 2021-01-24 DIAGNOSIS — G894 Chronic pain syndrome: Secondary | ICD-10-CM

## 2021-01-24 DIAGNOSIS — D571 Sickle-cell disease without crisis: Secondary | ICD-10-CM

## 2021-01-24 DIAGNOSIS — F1190 Opioid use, unspecified, uncomplicated: Secondary | ICD-10-CM

## 2021-01-24 MED ORDER — OXYCODONE HCL 10 MG PO TABS: 10.0000 mg | ORAL_TABLET | Freq: Four times a day (QID) | ORAL | 0 refills | Status: DC | PRN
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
## 2021-01-25 ENCOUNTER — Encounter: Payer: Self-pay | Admitting: Nurse Practitioner

## 2021-02-07 ENCOUNTER — Telehealth: Payer: Self-pay | Admitting: Nurse Practitioner

## 2021-02-07 NOTE — Telephone Encounter (Signed)
Patient call to office to speak with Crystal

## 2021-02-17 ENCOUNTER — Encounter: Payer: Self-pay | Admitting: Nurse Practitioner

## 2021-02-17 NOTE — Progress Notes (Signed)
   [place], [place]  [address] Phone:  [phone]   Fax:  [phone]  Requested letter completed

## 2021-02-21 ENCOUNTER — Other Ambulatory Visit: Payer: Self-pay

## 2021-02-21 MED ORDER — OXYCODONE HCL 10 MG PO TABS: 10.0000 mg | ORAL_TABLET | Freq: Four times a day (QID) | ORAL | 0 refills | Status: DC | PRN

## 2021-03-10 ENCOUNTER — Other Ambulatory Visit: Payer: Self-pay

## 2021-03-11 MED ORDER — OXYCODONE HCL 10 MG PO TABS: 10.0000 mg | ORAL_TABLET | Freq: Four times a day (QID) | ORAL | 0 refills | Status: DC | PRN

## 2021-03-27 ENCOUNTER — Ambulatory Visit (INDEPENDENT_AMBULATORY_CARE_PROVIDER_SITE_OTHER): Payer: Medicaid Other | Admitting: Nurse Practitioner

## 2021-03-27 ENCOUNTER — Encounter: Payer: Self-pay | Admitting: Nurse Practitioner

## 2021-03-27 ENCOUNTER — Other Ambulatory Visit: Payer: Self-pay

## 2021-03-27 VITALS — BP 138/91 | HR 70 | Temp 98.1°F | Ht 69.0 in | Wt 157.1 lb

## 2021-03-27 DIAGNOSIS — I10 Essential (primary) hypertension: Secondary | ICD-10-CM

## 2021-03-27 DIAGNOSIS — G894 Chronic pain syndrome: Secondary | ICD-10-CM

## 2021-03-27 DIAGNOSIS — F1190 Opioid use, unspecified, uncomplicated: Secondary | ICD-10-CM

## 2021-03-27 DIAGNOSIS — D571 Sickle-cell disease without crisis: Secondary | ICD-10-CM

## 2021-03-27 LAB — POCT URINALYSIS DIPSTICK
Bilirubin, UA: NEGATIVE
Blood, UA: NEGATIVE
Glucose, UA: NEGATIVE
Ketones, UA: NEGATIVE
Leukocytes, UA: NEGATIVE
Nitrite, UA: NEGATIVE
Protein, UA: NEGATIVE
Spec Grav, UA: 1.02 (ref 1.010–1.025)
Urobilinogen, UA: 0.2 E.U./dL
pH, UA: 5 (ref 5.0–8.0)

## 2021-03-27 MED ORDER — OXYCODONE HCL 10 MG PO TABS: 10.0000 mg | ORAL_TABLET | Freq: Four times a day (QID) | ORAL | 0 refills | Status: DC | PRN

## 2021-03-27 NOTE — Patient Instructions (Addendum)
[facility] [address] [place], [place] [address]  [phone]  Sickle Cell Anemia, Adult  Sickle cell anemia is a condition where your red blood cells are shaped like sickles. Red blood cells carry oxygen through the body. Sickle-shaped cells do not live as long as normal red blood cells. They also clump together and block blood from flowing through the blood vessels. This prevents the body from getting enough oxygen. Sickle cell anemia causes organ damage and pain. It alsoincreases the risk of infection. Follow these instructions at home: Medicines Take over-the-counter and prescription medicines only as told by your doctor. If you were prescribed an antibiotic medicine, take it as told by your doctor. Do not stop taking the antibiotic even if you start to feel better. If you develop a fever, do not take medicines to lower the fever right away. Tell your doctor about the fever. Managing pain, stiffness, and swelling Try these methods to help with pain: Use a heating pad. Take a warm bath. Distract yourself, such as by watching TV. Eating and drinking Drink enough fluid to keep your pee (urine) clear or pale yellow. Drink more in hot weather and during exercise. Limit or avoid alcohol. Eat a healthy diet. Eat plenty of fruits, vegetables, whole grains, and lean protein. Take vitamins and supplements as told by your doctor. Traveling When traveling, keep these with you: Your medical information. The names of your doctors. Your medicines. If you need to take an airplane, talk to your doctor first. Activity Rest often. Avoid exercises that make your heart beat much faster, such as jogging. General instructions Do not use products that have nicotine or tobacco, such as cigarettes and e-cigarettes. If you need help quitting, ask your doctor. Consider wearing a medical alert bracelet. Avoid being in high places (high altitudes), such as mountains. Avoid very hot or  cold temperatures. Avoid places where the temperature changes a lot. Keep all follow-up visits as told by your doctor. This is important. Contact a doctor if: A joint hurts. Your feet or hands hurt or swell. You feel tired (fatigued). Get help right away if: You have symptoms of infection. These include: Fever. Chills. Being very tired. Irritability. Poor eating. Throwing up (vomiting). You feel dizzy or faint. You have new stomach pain, especially on the left side. You have a an erection (priapism) that lasts more than 4 hours. You have numbness in your arms or legs. You have a hard time moving your arms or legs. You have trouble talking. You have pain that does not go away when you take medicine. You are short of breath. You are breathing fast. You have a long-term cough. You have pain in your chest. You have a bad headache. You have a stiff neck. Your stomach looks bloated even though you did not eat much. Your skin is pale. You suddenly cannot see well. Summary Sickle cell anemia is a condition where your red blood cells are shaped like sickles. Follow your doctor's advice on ways to manage pain, food to eat, activities to do, and steps to take for safe travel. Get medical help right away if you have any signs of infection, such as a fever. This information is not intended to replace advice given to you by your health care provider. Make sure you discuss any questions you have with your healthcare provider. Document Revised: 02/01/2020 Document Reviewed: 02/01/2020 Elsevier Patient Education  [place].
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
## 2021-03-28 LAB — DRUG SCREEN 764883 11+OXYCO+ALC+CRT-BUND
Amphetamines, Urine: NEGATIVE ng/mL
BENZODIAZ UR QL: NEGATIVE ng/mL
Barbiturate: NEGATIVE ng/mL
Cannabinoid Quant, Ur: NEGATIVE ng/mL
Cocaine (Metabolite): NEGATIVE ng/mL
Creatinine: 207.7 mg/dL (ref 20.0–300.0)
Ethanol: NEGATIVE %
Meperidine: NEGATIVE ng/mL
Methadone Screen, Urine: NEGATIVE ng/mL
OPIATE SCREEN URINE: NEGATIVE ng/mL
Oxycodone/Oxymorphone, Urine: NEGATIVE ng/mL
Phencyclidine: NEGATIVE ng/mL
Propoxyphene: NEGATIVE ng/mL
Tramadol: NEGATIVE ng/mL
pH, Urine: 5.5 (ref 4.5–8.9)

## 2021-04-10 ENCOUNTER — Other Ambulatory Visit: Payer: Self-pay

## 2021-04-14 ENCOUNTER — Other Ambulatory Visit (HOSPITAL_COMMUNITY): Payer: Self-pay | Admitting: Nurse Practitioner

## 2021-04-14 MED ORDER — OXYCODONE HCL 10 MG PO TABS: 10.0000 mg | ORAL_TABLET | Freq: Four times a day (QID) | ORAL | 0 refills | Status: DC | PRN

## 2021-05-07 ENCOUNTER — Other Ambulatory Visit: Payer: Self-pay | Admitting: Nurse Practitioner

## 2021-05-07 MED ORDER — OXYCODONE HCL 10 MG PO TABS: 10.0000 mg | ORAL_TABLET | Freq: Four times a day (QID) | ORAL | 0 refills | Status: DC | PRN

## 2021-06-10 ENCOUNTER — Other Ambulatory Visit: Payer: Self-pay

## 2021-06-11 ENCOUNTER — Other Ambulatory Visit: Payer: Self-pay

## 2021-06-12 MED ORDER — OXYCODONE HCL 10 MG PO TABS: 10.0000 mg | ORAL_TABLET | Freq: Four times a day (QID) | ORAL | 0 refills | Status: DC | PRN

## 2021-06-27 ENCOUNTER — Encounter: Payer: Self-pay | Admitting: Nurse Practitioner

## 2021-06-27 ENCOUNTER — Other Ambulatory Visit: Payer: Self-pay

## 2021-06-27 ENCOUNTER — Ambulatory Visit (INDEPENDENT_AMBULATORY_CARE_PROVIDER_SITE_OTHER): Payer: Self-pay | Admitting: Nurse Practitioner

## 2021-06-27 VITALS — BP 127/87 | HR 93 | Temp 98.1°F | Ht 69.0 in | Wt 159.6 lb

## 2021-06-27 DIAGNOSIS — K5900 Constipation, unspecified: Secondary | ICD-10-CM

## 2021-06-27 DIAGNOSIS — D571 Sickle-cell disease without crisis: Secondary | ICD-10-CM

## 2021-06-27 DIAGNOSIS — Z1211 Encounter for screening for malignant neoplasm of colon: Secondary | ICD-10-CM

## 2021-06-27 DIAGNOSIS — G894 Chronic pain syndrome: Secondary | ICD-10-CM

## 2021-06-27 DIAGNOSIS — K921 Melena: Secondary | ICD-10-CM

## 2021-06-27 DIAGNOSIS — F17209 Nicotine dependence, unspecified, with unspecified nicotine-induced disorders: Secondary | ICD-10-CM

## 2021-06-27 DIAGNOSIS — G4700 Insomnia, unspecified: Secondary | ICD-10-CM

## 2021-06-27 DIAGNOSIS — I10 Essential (primary) hypertension: Secondary | ICD-10-CM

## 2021-06-27 LAB — POCT URINALYSIS DIP (CLINITEK)
Bilirubin, UA: NEGATIVE
Blood, UA: NEGATIVE
Glucose, UA: NEGATIVE mg/dL
Ketones, POC UA: NEGATIVE mg/dL
Leukocytes, UA: NEGATIVE
Nitrite, UA: NEGATIVE
POC PROTEIN,UA: NEGATIVE
Spec Grav, UA: 1.015 (ref 1.010–1.025)
Urobilinogen, UA: 0.2 E.U./dL
pH, UA: 5.5 (ref 5.0–8.0)

## 2021-06-27 MED ORDER — OXYCODONE HCL 10 MG PO TABS: 10.0000 mg | ORAL_TABLET | Freq: Four times a day (QID) | ORAL | 0 refills | Status: DC | PRN
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
## 2021-06-27 NOTE — Patient Instructions (Addendum)
Sickle Cell Anemia, Adult Sickle cell anemia is a condition where your red blood cells are shaped like sickles. Red blood cells carry oxygen through the body. Sickle-shaped cells do not live as long as normal red blood cells. They also clump together and block blood from flowing through the blood vessels. This prevents the body from getting enough oxygen. Sickle cell anemia causes organ damage and pain. It also increases the risk of infection. Follow these instructions at home: Medicines Take over-the-counter and prescription medicines only as told by your doctor. If you were prescribed an antibiotic medicine, take it as told by your doctor. Do not stop taking the antibiotic even if you start to feel better. If you develop a fever, do not take medicines to lower the fever right away. Tell your doctor about the fever. Managing pain, stiffness, and swelling Try these methods to help with pain: Use a heating pad. Take a warm bath. Distract yourself, such as by watching TV. Eating and drinking Drink enough fluid to keep your pee (urine) clear or pale yellow. Drink more in hot weather and during exercise. Limit or avoid alcohol. Eat a healthy diet. Eat plenty of fruits, vegetables, whole grains, and lean protein. Take vitamins and supplements as told by your doctor. Traveling When traveling, keep these with you: Your medical information. The names of your doctors. Your medicines. If you need to take an airplane, talk to your doctor first. Activity Rest often. Avoid exercises that make your heart beat much faster, such as jogging. General instructions Do not use products that have nicotine or tobacco, such as cigarettes and e-cigarettes. If you need help quitting, ask your doctor. Consider wearing a medical alert bracelet. Avoid being in high places (high altitudes), such as mountains. Avoid very hot or cold temperatures. Avoid places where the temperature changes a lot. Keep all follow-up  visits as told by your doctor. This is important. Contact a doctor if: A joint hurts. Your feet or hands hurt or swell. You feel tired (fatigued). Get help right away if: You have symptoms of infection. These include: Fever. Chills. Being very tired. Irritability. Poor eating. Throwing up (vomiting). You feel dizzy or faint. You have new stomach pain, especially on the left side. You have a an erection (priapism) that lasts more than 4 hours. You have numbness in your arms or legs. You have a hard time moving your arms or legs. You have trouble talking. You have pain that does not go away when you take medicine. You are short of breath. You are breathing fast. You have a long-term cough. You have pain in your chest. You have a bad headache. You have a stiff neck. Your stomach looks bloated even though you did not eat much. Your skin is pale. You suddenly cannot see well. Summary Sickle cell anemia is a condition where your red blood cells are shaped like sickles. Follow your doctor's advice on ways to manage pain, food to eat, activities to do, and steps to take for safe travel. Get medical help right away if you have any signs of infection, such as a fever. This information is not intended to replace advice given to you by your health care provider. Make sure you discuss any questions you have with your health care provider. Document Revised: 02/01/2020 Document Reviewed: 02/01/2020 Elsevier Patient Education  [place].  Constipation, Adult Constipation is when a person has trouble pooping (having a bowel movement). When you have this condition, you may poop fewer  than 3 times a week. Your poop (stool) may also be dry, hard, or bigger than normal. Follow these instructions at home: Eating and drinking  Eat foods that have a lot of fiber, such as: Fresh fruits and vegetables. Whole grains. Beans. Eat less of foods that are low in fiber and high in fat and  sugar, such as: [place] fries. Hamburgers. Cookies. Candy. Soda. Drink enough fluid to keep your pee (urine) pale yellow. General instructions Exercise regularly or as told by your doctor. Try to do 150 minutes of exercise each week. Go to the restroom when you feel like you need to poop. Do not hold it in. Take over-the-counter and prescription medicines only as told by your doctor. These include any fiber supplements. When you poop: Do deep breathing while relaxing your lower belly (abdomen). Relax your pelvic floor. The pelvic floor is a group of muscles that support the rectum, bladder, and intestines (as well as the uterus in women). Watch your condition for any changes. Tell your doctor if you notice any. Keep all follow-up visits as told by your doctor. This is important. Contact a doctor if: You have pain that gets worse. You have a fever. You have not pooped for 4 days. You vomit. You are not hungry. You lose weight. You are bleeding from the opening of the butt (anus). You have thin, pencil-like poop. Get help right away if: You have a fever, and your symptoms suddenly get worse. You leak poop or have blood in your poop. Your belly feels hard or bigger than normal (bloated). You have very bad belly pain. You feel dizzy or you faint. Summary Constipation is when a person poops fewer than 3 times a week, has trouble pooping, or has poop that is dry, hard, or bigger than normal. Eat foods that have a lot of fiber. Drink enough fluid to keep your pee (urine) pale yellow. Take over-the-counter and prescription medicines only as told by your doctor. These include any fiber supplements. This information is not intended to replace advice given to you by your health care provider. Make sure you discuss any questions you have with your health care provider. Document Revised: 07/26/2019 Document Reviewed: 07/26/2019 Elsevier Patient Education  2022 [place].  Colonoscopy,  Adult A colonoscopy is a procedure to look at the entire large intestine. This procedure is done using a long, thin, flexible tube that has a camera on the end. You may have a colonoscopy: As a part of normal colorectal screening. If you have certain symptoms, such as: A low number of red blood cells in your blood (anemia). Diarrhea that does not go away. Pain in your abdomen. Blood in your stool. A colonoscopy can help screen for and diagnose medical problems, including: Tumors. Extra tissue that grows where mucus forms (polyps). Inflammation. Areas of bleeding. Tell your health care provider about: Any allergies you have. All medicines you are taking, including vitamins, herbs, eye drops, creams, and over-the-counter medicines. Any problems you or family members have had with anesthetic medicines. Any blood disorders you have. Any surgeries you have had. Any medical conditions you have. Any problems you have had with having bowel movements. Whether you are pregnant or may be pregnant. What are the risks? Generally, this is a safe procedure. However, problems may occur, including: Bleeding. Damage to your intestine. Allergic reactions to medicines given during the procedure. Infection. This is rare. What happens before the procedure? Eating and drinking restrictions Follow instructions from your health care  provider about eating or drinking restrictions, which may include: A few days before the procedure: Follow a low-fiber diet. Avoid nuts, seeds, dried fruit, raw fruits, and vegetables. 1-3 days before the procedure: Eat only gelatin dessert or ice pops. Drink only clear liquids, such as water, clear juice, clear broth or bouillon, black coffee or tea, or clear soft drinks or sports drinks. Avoid liquids that contain red or purple dye. The day of the procedure: Do not eat solid foods. You may continue to drink clear liquids until up to 2 hours before the procedure. Do not  eat or drink anything starting 2 hours before the procedure, or within the time period that your health care provider recommends. Bowel prep If you were prescribed a bowel prep to take by mouth (orally) to clean out your colon: Take it as told by your health care provider. Starting the day before your procedure, you will need to drink a large amount of liquid medicine. The liquid will cause you to have many bowel movements of loose stool until your stool becomes almost clear or light green. If your skin or the opening between the buttocks (anus) gets irritated from diarrhea, you may relieve the irritation using: Wipes with medicine in them, such as adult wet wipes with aloe and vitamin E. A product to soothe skin, such as petroleum jelly. If you vomit while drinking the bowel prep: Take a break for up to 60 minutes. Begin the bowel prep again. Call your health care provider if you keep vomiting or you cannot take the bowel prep without vomiting. To clean out your colon, you may also be given: Laxative medicines. These help you have a bowel movement. Instructions for enema use. An enema is liquid medicine injected into your rectum. Medicines Ask your health care provider about: Changing or stopping your regular medicines or supplements. This is especially important if you are taking iron supplements, diabetes medicines, or blood thinners. Taking medicines such as aspirin and ibuprofen. These medicines can thin your blood. Do not take these medicines unless your health care provider tells you to take them. Taking over-the-counter medicines, vitamins, herbs, and supplements. General instructions Ask your health care provider what steps will be taken to help prevent infection. These may include washing skin with a germ-killing soap. Plan to have someone take you home from the hospital or clinic. What happens during the procedure?  An IV will be inserted into one of your veins. You may be given  one or more of the following: A medicine to help you relax (sedative). A medicine to numb the area (local anesthetic). A medicine to make you fall asleep (general anesthetic). This is rarely needed. You will lie on your side with your knees bent. The tube will: Have oil or gel put on it (be lubricated). Be inserted into your anus. Be gently eased through all parts of your large intestine. Air will be sent into your colon to keep it open. This may cause some pressure or cramping. Images will be taken with the camera and will appear on a screen. A small tissue sample may be removed to be looked at under a microscope (biopsy). The tissue may be sent to a lab for testing if any signs of problems are found. If small polyps are found, they may be removed and checked for cancer cells. When the procedure is finished, the tube will be removed. The procedure may vary among health care providers and hospitals. What happens after the procedure? Your  blood pressure, heart rate, breathing rate, and blood oxygen level will be monitored until you leave the hospital or clinic. You may have a small amount of blood in your stool. You may pass gas and have mild cramping or bloating in your abdomen. This is caused by the air that was used to open your colon during the exam. Do not drive for 24 hours after the procedure. It is up to you to get the results of your procedure. Ask your health care provider, or the department that is doing the procedure, when your results will be ready. Summary A colonoscopy is a procedure to look at the entire large intestine. Follow instructions from your health care provider about eating and drinking before the procedure. If you were prescribed an oral bowel prep to clean out your colon, take it as told by your health care provider. During the colonoscopy, a flexible tube with a camera on its end is inserted into the anus and then passed into the other parts of the large  intestine. This information is not intended to replace advice given to you by your health care provider. Make sure you discuss any questions you have with your health care provider. Document Revised: 03/31/2019 Document Reviewed: 03/31/2019 Elsevier Patient Education  [place].

## 2021-06-28 LAB — PSA: Prostate Specific Ag, Serum: 0.8 ng/mL (ref 0.0–4.0)

## 2021-06-28 LAB — CMP14+CBC/D/PLT+FER+RETIC+V...
ALT: 14 IU/L (ref 0–44)
AST: 24 IU/L (ref 0–40)
Albumin/Globulin Ratio: 1.7 (ref 1.2–2.2)
Albumin: 4.2 g/dL (ref 4.0–5.0)
Alkaline Phosphatase: 90 IU/L (ref 44–121)
BUN/Creatinine Ratio: 7 — ABNORMAL LOW (ref 9–20)
BUN: 9 mg/dL (ref 6–24)
Basophils Absolute: 0.1 10*3/uL (ref 0.0–0.2)
Basos: 1 %
Bilirubin Total: 0.9 mg/dL (ref 0.0–1.2)
CO2: 22 mmol/L (ref 20–29)
Calcium: 9.4 mg/dL (ref 8.7–10.2)
Chloride: 103 mmol/L (ref 96–106)
Creatinine, Ser: 1.37 mg/dL — ABNORMAL HIGH (ref 0.76–1.27)
EOS (ABSOLUTE): 0.3 10*3/uL (ref 0.0–0.4)
Eos: 3 %
Ferritin: 39 ng/mL (ref 30–400)
Globulin, Total: 2.5 g/dL (ref 1.5–4.5)
Glucose: 89 mg/dL (ref 70–99)
Hematocrit: 36.5 % — ABNORMAL LOW (ref 37.5–51.0)
Hemoglobin: 11.9 g/dL — ABNORMAL LOW (ref 13.0–17.7)
Immature Grans (Abs): 0 10*3/uL (ref 0.0–0.1)
Immature Granulocytes: 0 %
Lymphocytes Absolute: 2.2 10*3/uL (ref 0.7–3.1)
Lymphs: 28 %
MCH: 26.4 pg — ABNORMAL LOW (ref 26.6–33.0)
MCHC: 32.6 g/dL (ref 31.5–35.7)
MCV: 81 fL (ref 79–97)
Monocytes Absolute: 0.8 10*3/uL (ref 0.1–0.9)
Monocytes: 11 %
Neutrophils Absolute: 4.4 10*3/uL (ref 1.4–7.0)
Neutrophils: 57 %
Platelets: 525 10*3/uL — ABNORMAL HIGH (ref 150–450)
Potassium: 4.3 mmol/L (ref 3.5–5.2)
RBC: 4.5 x10E6/uL (ref 4.14–5.80)
RDW: 16.1 % — ABNORMAL HIGH (ref 11.6–15.4)
Retic Ct Pct: 2.5 % (ref 0.6–2.6)
Sodium: 141 mmol/L (ref 134–144)
Total Protein: 6.7 g/dL (ref 6.0–8.5)
Vit D, 25-Hydroxy: 22 ng/mL — ABNORMAL LOW (ref 30.0–100.0)
WBC: 7.8 10*3/uL (ref 3.4–10.8)
eGFR: 64 mL/min/{1.73_m2} (ref >59)

## 2021-07-22 ENCOUNTER — Other Ambulatory Visit: Payer: Self-pay

## 2021-07-24 MED ORDER — OXYCODONE HCL 10 MG PO TABS: 10.0000 mg | ORAL_TABLET | Freq: Four times a day (QID) | ORAL | 0 refills | Status: DC | PRN

## 2021-08-27 ENCOUNTER — Encounter: Payer: Self-pay | Admitting: Nurse Practitioner

## 2021-08-27 ENCOUNTER — Other Ambulatory Visit: Payer: Self-pay

## 2021-08-27 ENCOUNTER — Ambulatory Visit (INDEPENDENT_AMBULATORY_CARE_PROVIDER_SITE_OTHER): Payer: Self-pay | Admitting: Nurse Practitioner

## 2021-08-27 VITALS — BP 142/92 | HR 80 | Temp 98.2°F | Ht 69.0 in | Wt 162.8 lb

## 2021-08-27 DIAGNOSIS — E559 Vitamin D deficiency, unspecified: Secondary | ICD-10-CM

## 2021-08-27 DIAGNOSIS — G894 Chronic pain syndrome: Secondary | ICD-10-CM

## 2021-08-27 DIAGNOSIS — D571 Sickle-cell disease without crisis: Secondary | ICD-10-CM

## 2021-08-27 DIAGNOSIS — I10 Essential (primary) hypertension: Secondary | ICD-10-CM

## 2021-08-27 DIAGNOSIS — F17209 Nicotine dependence, unspecified, with unspecified nicotine-induced disorders: Secondary | ICD-10-CM

## 2021-08-27 MED ORDER — IBUPROFEN 800 MG PO TABS: 800.0000 mg | ORAL_TABLET | ORAL | 11 refills | Status: AC | PRN

## 2021-08-27 MED ORDER — OXYCODONE HCL 10 MG PO TABS: 10.0000 mg | ORAL_TABLET | Freq: Four times a day (QID) | ORAL | 0 refills | Status: DC | PRN
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
## 2021-08-27 NOTE — Patient Instructions (Addendum)
Sickle Cell Anemia, Adult Sickle cell anemia is a condition where your red blood cells are shaped like sickles. Red blood cells carry oxygen through the body. Sickle-shaped cells do not live as long as normal red blood cells. They also clump together and block blood from flowing through the blood vessels. This prevents the body from getting enough oxygen. Sickle cell anemia causes organ damage and pain. It also increases the risk of infection. Follow these instructions at home: Medicines Take over-the-counter and prescription medicines only as told by your doctor. If you were prescribed an antibiotic medicine, take it as told by your doctor. Do not stop taking the antibiotic even if you start to feel better. If you develop a fever, do not take medicines to lower the fever right away. Tell your doctor about the fever. Managing pain, stiffness, and swelling Try these methods to help with pain: Use a heating pad. Take a warm bath. Distract yourself, such as by watching TV. Eating and drinking Drink enough fluid to keep your pee (urine) clear or pale yellow. Drink more in hot weather and during exercise. Limit or avoid alcohol. Eat a healthy diet. Eat plenty of fruits, vegetables, whole grains, and lean protein. Take vitamins and supplements as told by your doctor. Traveling When traveling, keep these with you: Your medical information. The names of your doctors. Your medicines. If you need to take an airplane, talk to your doctor first. Activity Rest often. Avoid exercises that make your heart beat much faster, such as jogging. General instructions Do not use products that have nicotine or tobacco, such as cigarettes and e-cigarettes. If you need help quitting, ask your doctor. Consider wearing a medical alert bracelet. Avoid being in high places (high altitudes), such as mountains. Avoid very hot or cold temperatures. Avoid places where the temperature changes a lot. Keep all follow-up  visits as told by your doctor. This is important. Contact a doctor if: A joint hurts. Your feet or hands hurt or swell. You feel tired (fatigued). Get help right away if: You have symptoms of infection. These include: Fever. Chills. Being very tired. Irritability. Poor eating. Throwing up (vomiting). You feel dizzy or faint. You have new stomach pain, especially on the left side. You have a an erection (priapism) that lasts more than 4 hours. You have numbness in your arms or legs. You have a hard time moving your arms or legs. You have trouble talking. You have pain that does not go away when you take medicine. You are short of breath. You are breathing fast. You have a long-term cough. You have pain in your chest. You have a bad headache. You have a stiff neck. Your stomach looks bloated even though you did not eat much. Your skin is pale. You suddenly cannot see well. Summary Sickle cell anemia is a condition where your red blood cells are shaped like sickles. Follow your doctor's advice on ways to manage pain, food to eat, activities to do, and steps to take for safe travel. Get medical help right away if you have any signs of infection, such as a fever. This information is not intended to replace advice given to you by your health care provider. Make sure you discuss any questions you have with your health care provider. Document Revised: 02/01/2020 Document Reviewed: 02/01/2020 Elsevier Patient Education  2022 [employer] [place] Your Hypertension Hypertension, also called high blood pressure, is when the force of the blood pressing against the walls of the arteries  is too strong. Arteries are blood vessels that carry blood from your heart throughout your body. Hypertension forces the heart to work harder to pump blood and may cause the arteries to become narrow or stiff. Understanding blood pressure readings Your personal target blood pressure may vary depending on  your medical conditions, your age, and other factors. A blood pressure reading includes a higher number over a lower number. Ideally, your blood pressure should be below 120/80. You should know that: The first, or top, number is called the systolic pressure. It is a measure of the pressure in your arteries as your heart beats. The second, or bottom number, is called the diastolic pressure. It is a measure of the pressure in your arteries as the heart relaxes. Blood pressure is classified into four stages. Based on your blood pressure reading, your health care provider may use the following stages to determine what type of treatment you need, if any. Systolic pressure and diastolic pressure are measured in a unit called mmHg. Normal Systolic pressure: below 300. Diastolic pressure: below 80. Elevated Systolic pressure: 762-263. Diastolic pressure: below 80. Hypertension stage 1 Systolic pressure: 335-456. Diastolic pressure: 25-63. Hypertension stage 2 Systolic pressure: 893 or above. Diastolic pressure: 90 or above. How can this condition affect me? Managing your hypertension is an important responsibility. Over time, hypertension can damage the arteries and decrease blood flow to important parts of the body, including the brain, heart, and kidneys. Having untreated or uncontrolled hypertension can lead to: A heart attack. A stroke. A weakened blood vessel (aneurysm). Heart failure. Kidney damage. Eye damage. Metabolic syndrome. Memory and concentration problems. Vascular dementia. What actions can I take to manage this condition? Hypertension can be managed by making lifestyle changes and possibly by taking medicines. Your health care provider will help you make a plan to bring your blood pressure within a normal range. Nutrition  Eat a diet that is high in fiber and potassium, and low in salt (sodium), added sugar, and fat. An example eating plan is called the Dietary Approaches to  Stop Hypertension (DASH) diet. To eat this way: Eat plenty of fresh fruits and vegetables. Try to fill one-half of your plate at each meal with fruits and vegetables. Eat whole grains, such as whole-wheat pasta, brown rice, or whole-grain bread. Fill about one-fourth of your plate with whole grains. Eat low-fat dairy products. Avoid fatty cuts of meat, processed or cured meats, and poultry with skin. Fill about one-fourth of your plate with lean proteins such as fish, chicken without skin, beans, eggs, and tofu. Avoid pre-made and processed foods. These tend to be higher in sodium, added sugar, and fat. Reduce your daily sodium intake. Most people with hypertension should eat less than 1,500 mg of sodium a day. Lifestyle  Work with your health care provider to maintain a healthy body weight or to lose weight. Ask what an ideal weight is for you. Get at least 30 minutes of exercise that causes your heart to beat faster (aerobic exercise) most days of the week. Activities may include walking, swimming, or biking. Include exercise to strengthen your muscles (resistance exercise), such as weight lifting, as part of your weekly exercise routine. Try to do these types of exercises for 30 minutes at least 3 days a week. Do not use any products that contain nicotine or tobacco, such as cigarettes, e-cigarettes, and chewing tobacco. If you need help quitting, ask your health care provider. Control any long-term (chronic) conditions you have, such as  high cholesterol or diabetes. Identify your sources of stress and find ways to manage stress. This may include meditation, deep breathing, or making time for fun activities. Alcohol use Do not drink alcohol if: Your health care provider tells you not to drink. You are pregnant, may be pregnant, or are planning to become pregnant. If you drink alcohol: Limit how much you use to: 0-1 drink a day for women. 0-2 drinks a day for men. Be aware of how much  alcohol is in your drink. In the [place] one drink equals one 12 oz bottle of beer (355 mL), one 5 oz glass of wine (148 mL), or one 1 oz glass of hard liquor (44 mL). Medicines Your health care provider may prescribe medicine if lifestyle changes are not enough to get your blood pressure under control and if: Your systolic blood pressure is 130 or higher. Your diastolic blood pressure is 80 or higher. Take medicines only as told by your health care provider. Follow the directions carefully. Blood pressure medicines must be taken as told by your health care provider. The medicine does not work as well when you skip doses. Skipping doses also puts you at risk for problems. Monitoring Before you monitor your blood pressure: Do not smoke, drink caffeinated beverages, or exercise within 30 minutes before taking a measurement. Use the bathroom and empty your bladder (urinate). Sit quietly for at least 5 minutes before taking measurements. Monitor your blood pressure at home as told by your health care provider. To do this: Sit with your back straight and supported. Place your feet flat on the floor. Do not cross your legs. Support your arm on a flat surface, such as a table. Make sure your upper arm is at heart level. Each time you measure, take two or three readings one minute apart and record the results. You may also need to have your blood pressure checked regularly by your health care provider. General information Talk with your health care provider about your diet, exercise habits, and other lifestyle factors that may be contributing to hypertension. Review all the medicines you take with your health care provider because there may be side effects or interactions. Keep all visits as told by your health care provider. Your health care provider can help you create and adjust your plan for managing your high blood pressure. Where to find more information National Heart, Lung, and Blood Institute:  https://wilson-eaton.com/ American Heart Association: www.heart.org Contact a health care provider if: You think you are having a reaction to medicines you have taken. You have repeated (recurrent) headaches. You feel dizzy. You have swelling in your ankles. You have trouble with your vision. Get help right away if: You develop a severe headache or confusion. You have unusual weakness or numbness, or you feel faint. You have severe pain in your chest or abdomen. You vomit repeatedly. You have trouble breathing. These symptoms may represent a serious problem that is an emergency. Do not wait to see if the symptoms will go away. Get medical help right away. Call your local emergency services (911 in the [place] Do not drive yourself to the hospital. Summary Hypertension is when the force of blood pumping through your arteries is too strong. If this condition is not controlled, it may put you at risk for serious complications. Your personal target blood pressure may vary depending on your medical conditions, your age, and other factors. For most people, a normal blood pressure is less than 120/80. Hypertension is managed  by lifestyle changes, medicines, or both. Lifestyle changes to help manage hypertension include losing weight, eating a healthy, low-sodium diet, exercising more, stopping smoking, and limiting alcohol. This information is not intended to replace advice given to you by your health care provider. Make sure you discuss any questions you have with your health care provider. Document Revised: 09/25/2019 Document Reviewed: 08/08/2019 Elsevier Patient Education  2022 [employer].

## 2021-08-28 LAB — CMP14+CBC/D/PLT+FER+RETIC+V...
ALT: 16 IU/L (ref 0–44)
AST: 20 IU/L (ref 0–40)
Albumin/Globulin Ratio: 2 (ref 1.2–2.2)
Albumin: 4.3 g/dL (ref 4.0–5.0)
Alkaline Phosphatase: 90 IU/L (ref 44–121)
BUN/Creatinine Ratio: 10 (ref 9–20)
BUN: 12 mg/dL (ref 6–24)
Basophils Absolute: 0.1 10*3/uL (ref 0.0–0.2)
Basos: 1 %
Bilirubin Total: 0.7 mg/dL (ref 0.0–1.2)
CO2: 23 mmol/L (ref 20–29)
Calcium: 9.1 mg/dL (ref 8.7–10.2)
Chloride: 105 mmol/L (ref 96–106)
Creatinine, Ser: 1.18 mg/dL (ref 0.76–1.27)
EOS (ABSOLUTE): 0.1 10*3/uL (ref 0.0–0.4)
Eos: 1 %
Ferritin: 48 ng/mL (ref 30–400)
Globulin, Total: 2.2 g/dL (ref 1.5–4.5)
Glucose: 76 mg/dL (ref 70–99)
Hematocrit: 33.2 % — ABNORMAL LOW (ref 37.5–51.0)
Hemoglobin: 11 g/dL — ABNORMAL LOW (ref 13.0–17.7)
Immature Grans (Abs): 0 10*3/uL (ref 0.0–0.1)
Immature Granulocytes: 0 %
Lymphocytes Absolute: 2.8 10*3/uL (ref 0.7–3.1)
Lymphs: 29 %
MCH: 26.4 pg — ABNORMAL LOW (ref 26.6–33.0)
MCHC: 33.1 g/dL (ref 31.5–35.7)
MCV: 80 fL (ref 79–97)
Monocytes Absolute: 1.4 10*3/uL — ABNORMAL HIGH (ref 0.1–0.9)
Monocytes: 14 %
NRBC: 1 % — ABNORMAL HIGH (ref 0–0)
Neutrophils Absolute: 5.4 10*3/uL (ref 1.4–7.0)
Neutrophils: 55 %
Platelets: 542 10*3/uL — ABNORMAL HIGH (ref 150–450)
Potassium: 4.1 mmol/L (ref 3.5–5.2)
RBC: 4.16 x10E6/uL (ref 4.14–5.80)
RDW: 16.3 % — ABNORMAL HIGH (ref 11.6–15.4)
Retic Ct Pct: 2.3 % (ref 0.6–2.6)
Sodium: 141 mmol/L (ref 134–144)
Total Protein: 6.5 g/dL (ref 6.0–8.5)
Vit D, 25-Hydroxy: 38.4 ng/mL (ref 30.0–100.0)
WBC: 9.8 10*3/uL (ref 3.4–10.8)
eGFR: 77 mL/min/{1.73_m2} (ref >59)

## 2021-09-11 ENCOUNTER — Other Ambulatory Visit: Payer: Self-pay | Admitting: Nurse Practitioner

## 2021-09-11 ENCOUNTER — Telehealth: Payer: Self-pay

## 2021-09-11 MED ORDER — OXYCODONE HCL 10 MG PO TABS: 10.0000 mg | ORAL_TABLET | Freq: Four times a day (QID) | ORAL | 0 refills | Status: DC | PRN

## 2021-09-11 NOTE — Telephone Encounter (Signed)
Oxycodone  °

## 2021-09-26 ENCOUNTER — Other Ambulatory Visit: Payer: Self-pay | Admitting: Nurse Practitioner

## 2021-09-26 MED ORDER — OXYCODONE HCL 10 MG PO TABS: 10.0000 mg | ORAL_TABLET | Freq: Four times a day (QID) | ORAL | 0 refills | Status: DC | PRN

## 2021-10-20 ENCOUNTER — Other Ambulatory Visit: Payer: Self-pay | Admitting: Nurse Practitioner

## 2021-10-20 MED ORDER — OXYCODONE HCL 10 MG PO TABS: 10.0000 mg | ORAL_TABLET | Freq: Four times a day (QID) | ORAL | 0 refills | Status: DC | PRN

## 2021-10-26 ENCOUNTER — Encounter: Payer: Self-pay | Admitting: Nurse Practitioner

## 2021-10-26 DIAGNOSIS — D5720 Sickle-cell/Hb-C disease without crisis: Secondary | ICD-10-CM | POA: Insufficient documentation

## 2021-10-29 ENCOUNTER — Ambulatory Visit (INDEPENDENT_AMBULATORY_CARE_PROVIDER_SITE_OTHER): Payer: Self-pay | Admitting: Nurse Practitioner

## 2021-10-29 ENCOUNTER — Other Ambulatory Visit: Payer: Self-pay

## 2021-10-29 ENCOUNTER — Encounter: Payer: Self-pay | Admitting: Nurse Practitioner

## 2021-10-29 VITALS — BP 152/98 | HR 84 | Temp 98.1°F | Ht 69.0 in | Wt 164.1 lb

## 2021-10-29 DIAGNOSIS — G4700 Insomnia, unspecified: Secondary | ICD-10-CM

## 2021-10-29 DIAGNOSIS — F17209 Nicotine dependence, unspecified, with unspecified nicotine-induced disorders: Secondary | ICD-10-CM

## 2021-10-29 DIAGNOSIS — I10 Essential (primary) hypertension: Secondary | ICD-10-CM

## 2021-10-29 DIAGNOSIS — R5383 Other fatigue: Secondary | ICD-10-CM

## 2021-10-29 DIAGNOSIS — D571 Sickle-cell disease without crisis: Secondary | ICD-10-CM

## 2021-10-29 NOTE — Patient Instructions (Signed)
Fatigue °If you have fatigue, you feel tired all the time and have a lack of energy or a lack of motivation. Fatigue may make it difficult to start or complete tasks because of exhaustion. In general, occasional or mild fatigue is often a normal response to activity or life. However, long-lasting (chronic) or extreme fatigue may be a symptom of a medical condition. °Follow these instructions at home: °General instructions °Watch your fatigue for any changes. °Go to bed and get up at the same time every day. °Avoid fatigue by pacing yourself during the day and getting enough sleep at night. °Maintain a healthy weight. °Medicines °Take over-the-counter and prescription medicines only as told by your health care provider. °Take a multivitamin, if told by your health care provider.  °Do not use herbal or dietary supplements unless they are approved by your health care provider. °Activity ° °Exercise regularly, as told by your health care provider. °Use or practice techniques to help you relax, such as yoga, tai chi, meditation, or massage therapy. °Eating and drinking ° °Avoid heavy meals in the evening. °Eat a well-balanced diet, which includes lean proteins, whole grains, plenty of fruits and vegetables, and low-fat dairy products. °Avoid consuming too much caffeine. °Avoid the use of alcohol. °Drink enough fluid to keep your urine pale yellow. °Lifestyle °Change situations that cause you stress. Try to keep your work and personal schedule in balance. °Do not use any products that contain nicotine or tobacco, such as cigarettes and e-cigarettes. If you need help quitting, ask your health care provider. °Do not use drugs. °Contact a health care provider if: °Your fatigue does not get better. °You have a fever. °You suddenly lose or gain weight. °You have headaches. °You have trouble falling asleep or sleeping through the night. °You feel angry, guilty, anxious, or sad. °You are unable to have a bowel movement  (constipation). °Your skin is dry. °You have swelling in your legs or another part of your body. °Get help right away if: °You feel confused. °Your vision is blurry. °You feel faint or you pass out. °You have a severe headache. °You have severe pain in your abdomen, your back, or the area between your waist and hips (pelvis). °You have chest pain, shortness of breath, or an irregular or fast heartbeat. °You are unable to urinate, or you urinate less than normal. °You have abnormal bleeding, such as bleeding from the rectum, vagina, nose, lungs, or nipples. °You vomit blood. °You have thoughts about hurting yourself or others. °If you ever feel like you may hurt yourself or others, or have thoughts about taking your own life, get help right away. You can go to your nearest emergency department or call: °Your local emergency services (911 in the [place] °A suicide crisis helpline, such as the National Suicide Prevention Lifeline at 1-800-273-8255 or 988 in the [place] This is open 24 hours a day. °Summary °If you have fatigue, you feel tired all the time and have a lack of energy or a lack of motivation. °Fatigue may make it difficult to start or complete tasks because of exhaustion. °Long-lasting (chronic) or extreme fatigue may be a symptom of a medical condition. °Exercise regularly, as told by your health care provider. °Change situations that cause you stress. Try to keep your work and personal schedule in balance. °This information is not intended to replace advice given to you by your health care provider. Make sure you discuss any questions you have with your health care provider. °Document Revised:   04/02/2021 Document Reviewed: 07/18/2020 °Elsevier Patient Education © 2022 Elsevier Inc. ° °

## 2021-10-29 NOTE — Progress Notes (Signed)
Integrated Behavioral Health Case Management Referral Note  10/29/2021 Name: Ronald Hurst MRN: [id number] DOB: Oct 23, 1975 Avish Torry is a 46 y.o. year old male who sees Vevelyn Francois, NP for primary care. LCSW was consulted to assess patient's needs and assist the patient with [employer] .  Interpreter: No.   Interpreter Name & Language: none  Assessment: Patient would like to apply for social security disability.   Intervention: CSW reviewed [facility] referral with patient. Patient consented to referral and signed. CSW to submit. Provided patient with CSW contact information for additional questions.   Review of patient status, including review of consultants reports, relevant laboratory and other test results, and collaboration with appropriate care team members and the patient's provider was performed as part of comprehensive patient evaluation and provision of services.    Estanislado Emms, [facility] [phone]
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
## 2021-10-30 LAB — CMP14+CBC/D/PLT+FER+RETIC+V...
ALT: 13 IU/L (ref 0–44)
AST: 22 IU/L (ref 0–40)
Albumin/Globulin Ratio: 2.1 (ref 1.2–2.2)
Albumin: 4.4 g/dL (ref 4.0–5.0)
Alkaline Phosphatase: 74 IU/L (ref 44–121)
BUN/Creatinine Ratio: 7 — ABNORMAL LOW (ref 9–20)
BUN: 9 mg/dL (ref 6–24)
Basophils Absolute: 0.1 10*3/uL (ref 0.0–0.2)
Basos: 1 %
Bilirubin Total: 0.9 mg/dL (ref 0.0–1.2)
CO2: 23 mmol/L (ref 20–29)
Calcium: 9.1 mg/dL (ref 8.7–10.2)
Chloride: 105 mmol/L (ref 96–106)
Creatinine, Ser: 1.26 mg/dL (ref 0.76–1.27)
EOS (ABSOLUTE): 0.3 10*3/uL (ref 0.0–0.4)
Eos: 3 %
Ferritin: 55 ng/mL (ref 30–400)
Globulin, Total: 2.1 g/dL (ref 1.5–4.5)
Glucose: 86 mg/dL (ref 70–99)
Hematocrit: 35.7 % — ABNORMAL LOW (ref 37.5–51.0)
Hemoglobin: 11.6 g/dL — ABNORMAL LOW (ref 13.0–17.7)
Immature Grans (Abs): 0 10*3/uL (ref 0.0–0.1)
Immature Granulocytes: 0 %
Lymphocytes Absolute: 3.2 10*3/uL — ABNORMAL HIGH (ref 0.7–3.1)
Lymphs: 41 %
MCH: 26.5 pg — ABNORMAL LOW (ref 26.6–33.0)
MCHC: 32.5 g/dL (ref 31.5–35.7)
MCV: 82 fL (ref 79–97)
Monocytes Absolute: 0.8 10*3/uL (ref 0.1–0.9)
Monocytes: 10 %
Neutrophils Absolute: 3.6 10*3/uL (ref 1.4–7.0)
Neutrophils: 45 %
Platelets: 462 10*3/uL — ABNORMAL HIGH (ref 150–450)
Potassium: 4.3 mmol/L (ref 3.5–5.2)
RBC: 4.38 x10E6/uL (ref 4.14–5.80)
RDW: 15.1 % (ref 11.6–15.4)
Retic Ct Pct: 2.3 % (ref 0.6–2.6)
Sodium: 143 mmol/L (ref 134–144)
Total Protein: 6.5 g/dL (ref 6.0–8.5)
Vit D, 25-Hydroxy: 47.4 ng/mL (ref 30.0–100.0)
WBC: 7.9 10*3/uL (ref 3.4–10.8)
eGFR: 71 mL/min/{1.73_m2} (ref >59)

## 2021-10-30 LAB — VITAMIN B12: Vitamin B-12: 830 pg/mL (ref 232–1245)

## 2021-11-07 ENCOUNTER — Other Ambulatory Visit: Payer: Self-pay | Admitting: Nurse Practitioner

## 2021-11-07 MED ORDER — OXYCODONE HCL 10 MG PO TABS: 10.0000 mg | ORAL_TABLET | Freq: Four times a day (QID) | ORAL | 0 refills | Status: DC | PRN

## 2021-11-26 ENCOUNTER — Other Ambulatory Visit: Payer: Self-pay | Admitting: Nurse Practitioner

## 2021-11-26 MED ORDER — OXYCODONE HCL 10 MG PO TABS: 10.0000 mg | ORAL_TABLET | Freq: Four times a day (QID) | ORAL | 0 refills | Status: DC | PRN

## 2021-12-12 ENCOUNTER — Other Ambulatory Visit: Payer: Self-pay | Admitting: Nurse Practitioner

## 2021-12-12 ENCOUNTER — Other Ambulatory Visit: Payer: Self-pay

## 2021-12-12 MED ORDER — OXYCODONE HCL 10 MG PO TABS: 10.0000 mg | ORAL_TABLET | Freq: Four times a day (QID) | ORAL | 0 refills | Status: DC | PRN

## 2021-12-15 ENCOUNTER — Encounter: Payer: Self-pay | Admitting: Nurse Practitioner

## 2021-12-15 ENCOUNTER — Other Ambulatory Visit: Payer: Self-pay

## 2021-12-15 ENCOUNTER — Ambulatory Visit (INDEPENDENT_AMBULATORY_CARE_PROVIDER_SITE_OTHER): Payer: BC Managed Care – PPO | Admitting: Nurse Practitioner

## 2021-12-15 VITALS — BP 136/89 | HR 78 | Temp 98.0°F | Ht 69.0 in | Wt 158.8 lb

## 2021-12-15 DIAGNOSIS — E559 Vitamin D deficiency, unspecified: Secondary | ICD-10-CM

## 2021-12-15 DIAGNOSIS — D571 Sickle-cell disease without crisis: Secondary | ICD-10-CM | POA: Diagnosis not present

## 2021-12-15 DIAGNOSIS — I10 Essential (primary) hypertension: Secondary | ICD-10-CM

## 2021-12-15 MED ORDER — VITAMIN D (ERGOCALCIFEROL) 1.25 MG (50000 UNIT) PO CAPS: 50000.0000 [IU] | ORAL_CAPSULE | ORAL | 3 refills | Status: AC

## 2021-12-15 MED ORDER — AMLODIPINE BESYLATE 10 MG PO TABS: 10.0000 mg | ORAL_TABLET | Freq: Every day | ORAL | 11 refills | Status: AC

## 2021-12-15 MED ORDER — FOLIC ACID 1 MG PO TABS: 1.0000 mg | ORAL_TABLET | Freq: Every day | ORAL | Status: AC
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
## 2021-12-15 NOTE — Patient Instructions (Signed)
Managing Your Hypertension Hypertension, also called high blood pressure, is when the force of the blood pressing against the walls of the arteries is too strong. Arteries are blood vessels that carry blood from your heart throughout your body. Hypertension forces the heart to work harder to pump blood and may cause the arteries to become narrow or stiff. Understanding blood pressure readings Your personal target blood pressure may vary depending on your medical conditions, your age, and other factors. A blood pressure reading includes a higher number over a lower number. Ideally, your blood pressure should be below 120/80. You should know that: The first, or top, number is called the systolic pressure. It is a measure of the pressure in your arteries as your heart beats. The second, or bottom number, is called the diastolic pressure. It is a measure of the pressure in your arteries as the heart relaxes. Blood pressure is classified into four stages. Based on your blood pressure reading, your health care provider may use the following stages to determine what type of treatment you need, if any. Systolic pressure and diastolic pressure are measured in a unit called mmHg. Normal Systolic pressure: below 120. Diastolic pressure: below 80. Elevated Systolic pressure: 120-129. Diastolic pressure: below 80. Hypertension stage 1 Systolic pressure: 130-139. Diastolic pressure: 80-89. Hypertension stage 2 Systolic pressure: 140 or above. Diastolic pressure: 90 or above. How can this condition affect me? Managing your hypertension is an important responsibility. Over time, hypertension can damage the arteries and decrease blood flow to important parts of the body, including the brain, heart, and kidneys. Having untreated or uncontrolled hypertension can lead to: A heart attack. A stroke. A weakened blood vessel (aneurysm). Heart failure. Kidney damage. Eye damage. Metabolic syndrome. Memory and  concentration problems. Vascular dementia. What actions can I take to manage this condition? Hypertension can be managed by making lifestyle changes and possibly by taking medicines. Your health care provider will help you make a plan to bring your blood pressure within a normal range. Nutrition  Eat a diet that is high in fiber and potassium, and low in salt (sodium), added sugar, and fat. An example eating plan is called the Dietary Approaches to Stop Hypertension (DASH) diet. To eat this way: Eat plenty of fresh fruits and vegetables. Try to fill one-half of your plate at each meal with fruits and vegetables. Eat whole grains, such as whole-wheat pasta, brown rice, or whole-grain bread. Fill about one-fourth of your plate with whole grains. Eat low-fat dairy products. Avoid fatty cuts of meat, processed or cured meats, and poultry with skin. Fill about one-fourth of your plate with lean proteins such as fish, chicken without skin, beans, eggs, and tofu. Avoid pre-made and processed foods. These tend to be higher in sodium, added sugar, and fat. Reduce your daily sodium intake. Most people with hypertension should eat less than 1,500 mg of sodium a day. Lifestyle  Work with your health care provider to maintain a healthy body weight or to lose weight. Ask what an ideal weight is for you. Get at least 30 minutes of exercise that causes your heart to beat faster (aerobic exercise) most days of the week. Activities may include walking, swimming, or biking. Include exercise to strengthen your muscles (resistance exercise), such as weight lifting, as part of your weekly exercise routine. Try to do these types of exercises for 30 minutes at least 3 days a week. Do not use any products that contain nicotine or tobacco, such as cigarettes, e-cigarettes,   and chewing tobacco. If you need help quitting, ask your health care provider. Control any long-term (chronic) conditions you have, such as high  cholesterol or diabetes. Identify your sources of stress and find ways to manage stress. This may include meditation, deep breathing, or making time for fun activities. Alcohol use Do not drink alcohol if: Your health care provider tells you not to drink. You are pregnant, may be pregnant, or are planning to become pregnant. If you drink alcohol: Limit how much you use to: 0-1 drink a day for women. 0-2 drinks a day for men. Be aware of how much alcohol is in your drink. In the [place] one drink equals one 12 oz bottle of beer (355 mL), one 5 oz glass of wine (148 mL), or one 1 oz glass of hard liquor (44 mL). Medicines Your health care provider may prescribe medicine if lifestyle changes are not enough to get your blood pressure under control and if: Your systolic blood pressure is 130 or higher. Your diastolic blood pressure is 80 or higher. Take medicines only as told by your health care provider. Follow the directions carefully. Blood pressure medicines must be taken as told by your health care provider. The medicine does not work as well when you skip doses. Skipping doses also puts you at risk for problems. Monitoring Before you monitor your blood pressure: Do not smoke, drink caffeinated beverages, or exercise within 30 minutes before taking a measurement. Use the bathroom and empty your bladder (urinate). Sit quietly for at least 5 minutes before taking measurements. Monitor your blood pressure at home as told by your health care provider. To do this: Sit with your back straight and supported. Place your feet flat on the floor. Do not cross your legs. Support your arm on a flat surface, such as a table. Make sure your upper arm is at heart level. Each time you measure, take two or three readings one minute apart and record the results. You may also need to have your blood pressure checked regularly by your health care provider. General information Talk with your health care  provider about your diet, exercise habits, and other lifestyle factors that may be contributing to hypertension. Review all the medicines you take with your health care provider because there may be side effects or interactions. Keep all visits as told by your health care provider. Your health care provider can help you create and adjust your plan for managing your high blood pressure. Where to find more information National Heart, Lung, and Blood Institute: www.nhlbi.nih.gov American Heart Association: www.heart.org Contact a health care provider if: You think you are having a reaction to medicines you have taken. You have repeated (recurrent) headaches. You feel dizzy. You have swelling in your ankles. You have trouble with your vision. Get help right away if: You develop a severe headache or confusion. You have unusual weakness or numbness, or you feel faint. You have severe pain in your chest or abdomen. You vomit repeatedly. You have trouble breathing. These symptoms may represent a serious problem that is an emergency. Do not wait to see if the symptoms will go away. Get medical help right away. Call your local emergency services (911 in the [place] Do not drive yourself to the hospital. Summary Hypertension is when the force of blood pumping through your arteries is too strong. If this condition is not controlled, it may put you at risk for serious complications. Your personal target blood pressure may vary depending on   your medical conditions, your age, and other factors. For most people, a normal blood pressure is less than 120/80. Hypertension is managed by lifestyle changes, medicines, or both. Lifestyle changes to help manage hypertension include losing weight, eating a healthy, low-sodium diet, exercising more, stopping smoking, and limiting alcohol. This information is not intended to replace advice given to you by your health care provider. Make sure you discuss any questions  you have with your health care provider. Document Revised: 09/25/2019 Document Reviewed: 08/08/2019 Elsevier Patient Education  2022 [employer] Inc[employer]  

## 2021-12-29 ENCOUNTER — Other Ambulatory Visit: Payer: Self-pay | Admitting: Internal Medicine

## 2021-12-29 MED ORDER — OXYCODONE HCL 10 MG PO TABS: 10.0000 mg | ORAL_TABLET | Freq: Four times a day (QID) | ORAL | 0 refills | Status: DC | PRN

## 2021-12-31 ENCOUNTER — Ambulatory Visit: Payer: Medicaid Other | Admitting: Nurse Practitioner

## 2022-01-19 ENCOUNTER — Telehealth: Payer: Self-pay

## 2022-01-19 NOTE — Telephone Encounter (Signed)
Oxycodone  °

## 2022-01-20 ENCOUNTER — Other Ambulatory Visit: Payer: Self-pay | Admitting: Internal Medicine

## 2022-01-20 MED ORDER — OXYCODONE HCL 10 MG PO TABS: 10.0000 mg | ORAL_TABLET | Freq: Four times a day (QID) | ORAL | 0 refills | Status: DC | PRN

## 2022-02-11 IMAGING — MR MR ABDOMEN WO/W CM
18 of 20 series · 44 of 48 positions shown · IV contrast (gadavist)
Comparison: Noncontrast CT on 10/14/2020, and

CLINICAL DATA: Liver lesions on recent noncontrast CT. Sickle cell
anemia.

EXAM:
MRI ABDOMEN WITHOUT AND WITH CONTRAST
TECHNIQUE: Multiplanar multisequence MR imaging of the abdomen was performed
both before and after the administration of intravenous contrast.
CONTRAST:  7mL GADAVIST GADOBUTROL 1 MMOL/ML IV SOLN

[Series 3: T2 · coronal · 6.0mm · 1.56mm/px · 2 of 34 slices shown (1 of 2)]
[im 1/34]
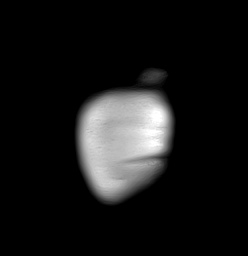
[im 34/34]
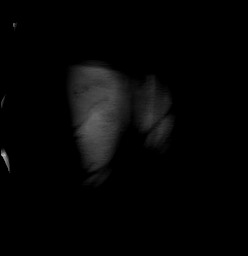

[Series 5: T2 fat-sat · axial · 6.0mm · 1.15mm/px · 1 of 42 slices shown]
[im 1/42]
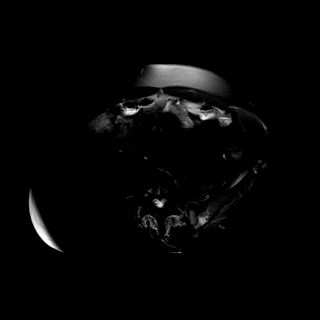

[Series 6: DWI · axial · 6.0mm · 1.42mm/px · z∈[-183,+112]mm · 3 of 84 slices shown (1 of 2)]
[im 1/84]
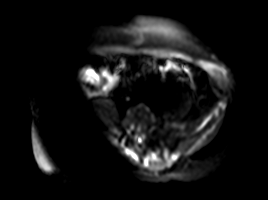
[im 42/84]
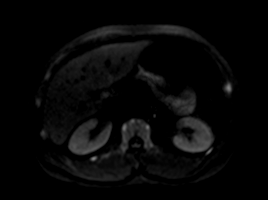
[im 84/84]
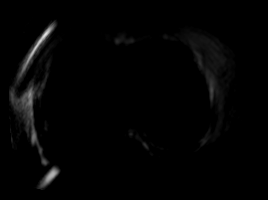

[Series 7: DWI · axial · 6.0mm · 1.42mm/px · 1 of 42 slices shown (2 of 2)]
[im 1/42]
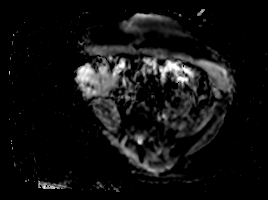

[Series 8: T1 · axial · 3.2mm · 1.19mm/px · z∈[-180,+99]mm · 3 of 88 slices shown (1 of 2)]
[im 1/88]
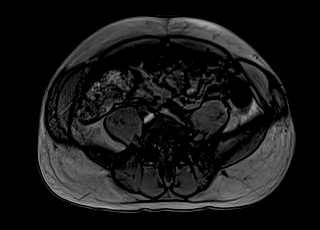
[im 44/88]
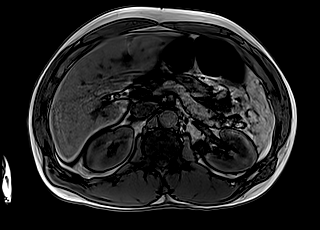
[im 88/88]
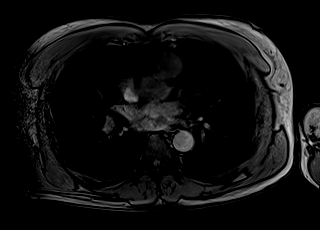

[Series 9: T1 · axial · 3.2mm · 1.19mm/px · z∈[-180,+99]mm · 3 of 88 slices shown (2 of 2)]
[im 1/88]
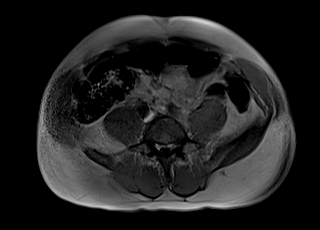
[im 44/88]
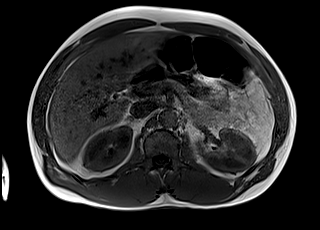
[im 88/88]
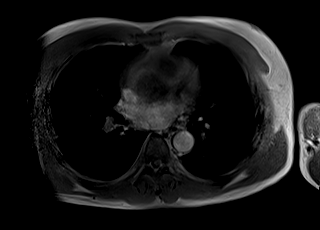

[Series 10: bSSFP · axial · 4.0mm · 0.74mm/px · z∈[-189,+95]mm · 2 of 72 slices shown]
[im 1/72]
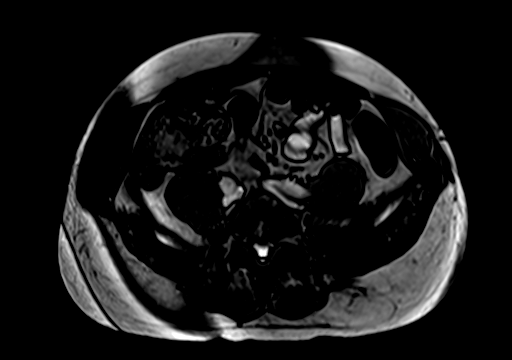
[im 72/72]
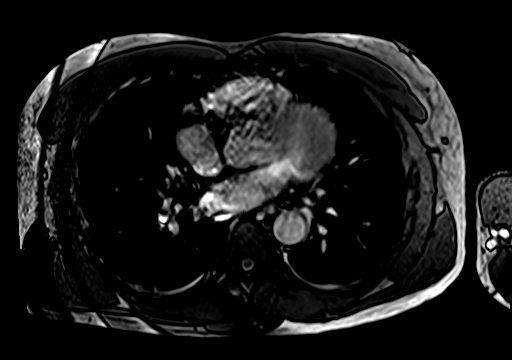

[Series 12: T1 dynamic · axial · 3.0mm · 1.19mm/px · z∈[-187,+98]mm · 3 of 96 slices shown (1 of 6)]
[im 1/96]
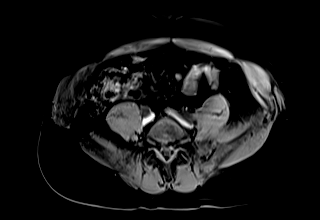
[im 48/96]
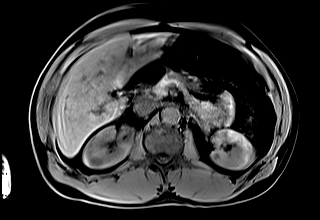
[im 96/96]
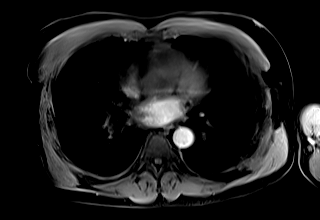

[Series 15: T1 dynamic · axial · 3.0mm · 1.19mm/px · z∈[-187,+98]mm · 3 of 96 slices shown (2 of 6)]
[im 1/96]
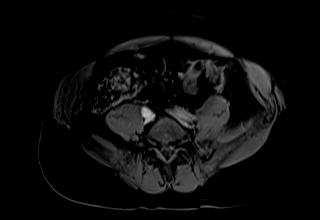
[im 48/96]
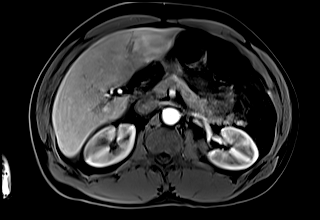
[im 96/96]
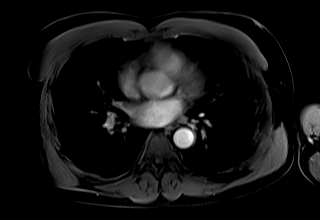

[Series 17: T1 dynamic · axial · 3.0mm · 1.19mm/px · z∈[-187,+98]mm · 3 of 96 slices shown (3 of 6)]
[im 1/96]
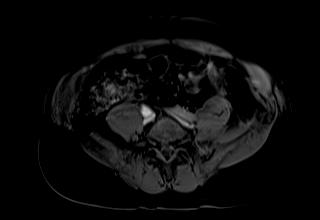
[im 48/96]
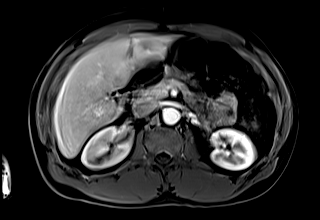
[im 96/96]
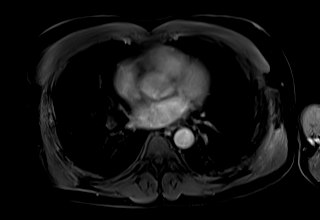

[Series 19: T1 dynamic · axial · 3.0mm · 1.19mm/px · z∈[-187,+98]mm · 3 of 96 slices shown (4 of 6)]
[im 1/96]
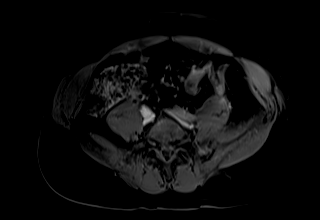
[im 48/96]
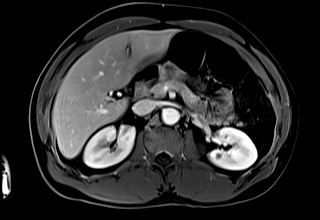
[im 96/96]
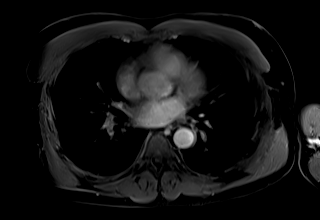

[Series 21: T1 dynamic · coronal · 5.0mm · 1.32mm/px · 2 of 48 slices shown (5 of 6)]
[im 1/48]
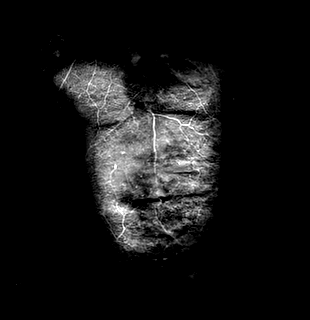
[im 48/48]
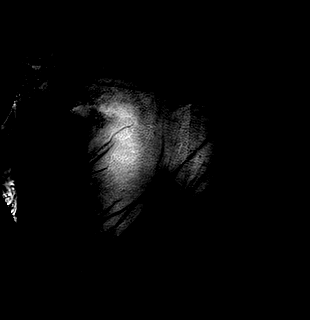

[Series 22: T2 · axial · 6.0mm · 1.48mm/px · 1 of 40 slices shown (2 of 2)]
[im 1/40]
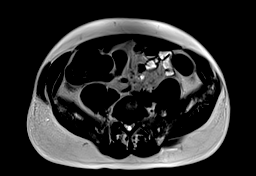

[Series 24: T1 dynamic · axial · 3.0mm · 1.19mm/px · z∈[-187,+98]mm · 3 of 96 slices shown (6 of 6)]
[im 1/96]
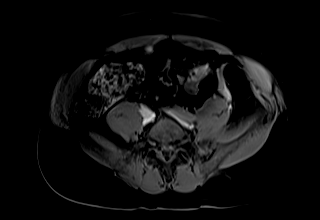
[im 48/96]
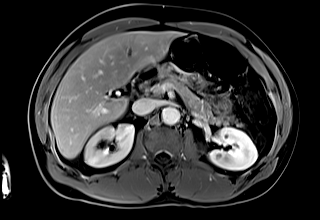
[im 96/96]
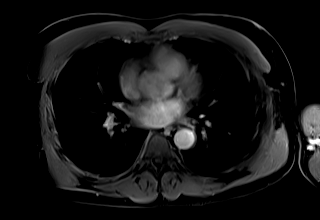

[Series 100: sub_20 sec · axial · 3.0mm · 1.19mm/px · z∈[-187,+98]mm · 3 of 96 slices shown]
[im 1/96]
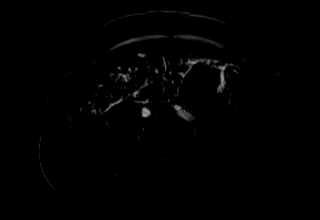
[im 48/96]
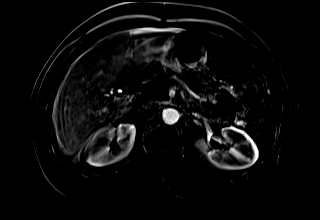
[im 96/96]
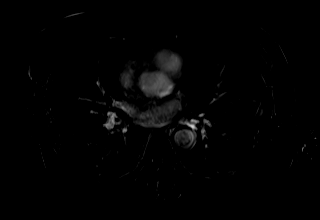

[Series 101: sub_45 sec · axial · 3.0mm · 1.19mm/px · z∈[-187,+98]mm · 3 of 96 slices shown]
[im 1/96]
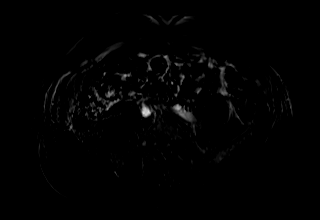
[im 48/96]
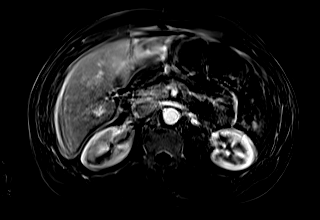
[im 96/96]
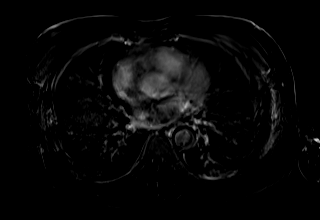

[Series 102: sub_90 sec · axial · 3.0mm · 1.19mm/px · z∈[-187,+98]mm · 3 of 96 slices shown]
[im 1/96]
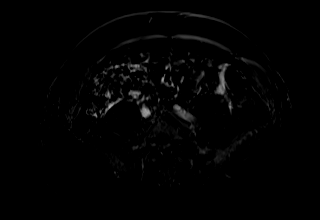
[im 48/96]
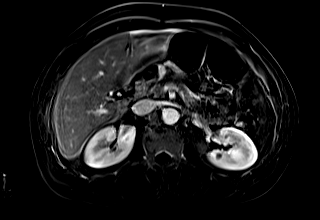
[im 96/96]
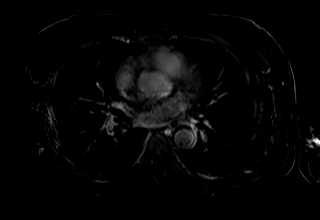

[Series 103: sub_delay · axial · 3.0mm · 1.19mm/px · z∈[-91,+98]mm · 2 of 64 slices shown]
[im 1/64]
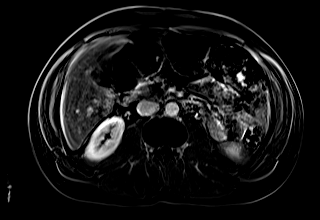
[im 64/64]
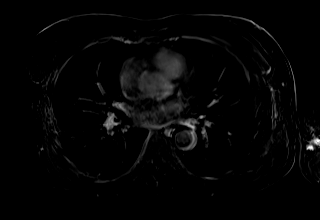

[44 of 48 positions shown; findings below may reference images not displayed]

FINDINGS: Lower chest: No acute findings.

Hepatobiliary: A 3.2 cm mass is seen in the anterior segment of the
right lobe which shows marked T2 hyperintensity and progressive
nodular contrast enhancement, consistent with a benign hemangioma.
An adjacent tiny sub-cm cyst is also seen. No other liver masses are
identified. Gallstones are seen, however there is no evidence of
cholecystitis or biliary dilatation.

Pancreas:  No mass or inflammatory changes.

Spleen: Tiny T2 hypointense spleen is seen, consistent with
autosplenectomy in the setting of sickle cell anemia.

Adrenals/Urinary Tract: No masses identified. Several renal cysts
are noted bilaterally. No evidence of hydronephrosis.

Stomach/Bowel: Visualized portion unremarkable.

Vascular/Lymphatic: No pathologically enlarged lymph nodes
identified. No abdominal aortic aneurysm.

Other:  None.

Musculoskeletal: Heterogeneous T2 hyperintensity is seen within the
L2, L3, and L4 vertebral bodies, without associated contrast
enhancement. These are consistent with bone infarcts in the setting
of sickle cell anemia.
IMPRESSION: 3.2 cm benign hemangioma in the right lobe. No evidence of
malignancy.

Cholelithiasis. No radiographic evidence of cholecystitis or biliary
dilatation.

Signal abnormality in lumbar vertebra, consistent with bone infarcts
in the setting of sickle cell anemia.

## 2022-02-13 ENCOUNTER — Ambulatory Visit (INDEPENDENT_AMBULATORY_CARE_PROVIDER_SITE_OTHER): Payer: BC Managed Care – PPO | Admitting: Nurse Practitioner

## 2022-02-13 ENCOUNTER — Encounter: Payer: Self-pay | Admitting: Nurse Practitioner

## 2022-02-13 VITALS — BP 140/105 | HR 78 | Temp 98.0°F | Ht 69.0 in | Wt 160.4 lb

## 2022-02-13 DIAGNOSIS — D571 Sickle-cell disease without crisis: Secondary | ICD-10-CM | POA: Diagnosis not present

## 2022-02-13 DIAGNOSIS — F17200 Nicotine dependence, unspecified, uncomplicated: Secondary | ICD-10-CM | POA: Diagnosis not present

## 2022-02-13 DIAGNOSIS — I10 Essential (primary) hypertension: Secondary | ICD-10-CM | POA: Diagnosis not present

## 2022-02-13 MED ORDER — OXYCODONE HCL 10 MG PO TABS: 10.0000 mg | ORAL_TABLET | Freq: Four times a day (QID) | ORAL | 0 refills | Status: AC | PRN

## 2022-02-13 MED ORDER — LISINOPRIL 40 MG PO TABS: 40.0000 mg | ORAL_TABLET | Freq: Every day | ORAL | 3 refills | Status: AC
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
## 2022-02-13 NOTE — Patient Instructions (Signed)
You were seen today in the [facility] for reevaluation of SCD. Labs were collected, results will be available via MyChart or, if abnormal, you will be contacted by clinic staff. You were prescribed medications, please take as directed. Please follow up in 3 mths for reevaluation of SCD 

## 2022-02-14 LAB — COMPREHENSIVE METABOLIC PANEL
ALT: 13 IU/L (ref 0–44)
AST: 25 IU/L (ref 0–40)
Albumin/Globulin Ratio: 2 (ref 1.2–2.2)
Albumin: 4.4 g/dL (ref 4.0–5.0)
Alkaline Phosphatase: 68 IU/L (ref 44–121)
BUN/Creatinine Ratio: 11 (ref 9–20)
BUN: 13 mg/dL (ref 6–24)
Bilirubin Total: 0.8 mg/dL (ref 0.0–1.2)
CO2: 22 mmol/L (ref 20–29)
Calcium: 9.2 mg/dL (ref 8.7–10.2)
Chloride: 105 mmol/L (ref 96–106)
Creatinine, Ser: 1.15 mg/dL (ref 0.76–1.27)
Globulin, Total: 2.2 g/dL (ref 1.5–4.5)
Glucose: 80 mg/dL (ref 70–99)
Potassium: 4.3 mmol/L (ref 3.5–5.2)
Sodium: 142 mmol/L (ref 134–144)
Total Protein: 6.6 g/dL (ref 6.0–8.5)
eGFR: 79 mL/min/{1.73_m2} (ref >59)

## 2022-02-14 LAB — CBC WITH DIFFERENTIAL/PLATELET
Basophils Absolute: 0.1 10*3/uL (ref 0.0–0.2)
Basos: 1 %
EOS (ABSOLUTE): 0.3 10*3/uL (ref 0.0–0.4)
Eos: 3 %
Hematocrit: 37.5 % (ref 37.5–51.0)
Hemoglobin: 12.2 g/dL — ABNORMAL LOW (ref 13.0–17.7)
Immature Grans (Abs): 0 10*3/uL (ref 0.0–0.1)
Immature Granulocytes: 0 %
Lymphocytes Absolute: 3.2 10*3/uL — ABNORMAL HIGH (ref 0.7–3.1)
Lymphs: 35 %
MCH: 26.9 pg (ref 26.6–33.0)
MCHC: 32.5 g/dL (ref 31.5–35.7)
MCV: 83 fL (ref 79–97)
Monocytes Absolute: 1.1 10*3/uL — ABNORMAL HIGH (ref 0.1–0.9)
Monocytes: 12 %
Neutrophils Absolute: 4.4 10*3/uL (ref 1.4–7.0)
Neutrophils: 49 %
Platelets: 468 10*3/uL — ABNORMAL HIGH (ref 150–450)
RBC: 4.53 x10E6/uL (ref 4.14–5.80)
RDW: 15.4 % (ref 11.6–15.4)
WBC: 9 10*3/uL (ref 3.4–10.8)

## 2022-02-14 LAB — LIPID PANEL
Chol/HDL Ratio: 3.8 ratio (ref 0.0–5.0)
Cholesterol, Total: 136 mg/dL (ref 100–199)
HDL: 36 mg/dL — ABNORMAL LOW (ref >39)
LDL Chol Calc (NIH): 74 mg/dL (ref 0–99)
Triglycerides: 147 mg/dL (ref 0–149)
VLDL Cholesterol Cal: 26 mg/dL (ref 5–40)

## 2022-02-14 LAB — FOLATE: Folate: 7.3 ng/mL (ref >3.0)

## 2022-02-14 LAB — VITAMIN D 25 HYDROXY (VIT D DEFICIENCY, FRACTURES): Vit D, 25-Hydroxy: 50.7 ng/mL (ref 30.0–100.0)

## 2022-05-14 ENCOUNTER — Other Ambulatory Visit (HOSPITAL_COMMUNITY): Payer: Self-pay

## 2022-05-14 MED ORDER — OXYCODONE HCL 10 MG PO TABS
ORAL_TABLET | ORAL | 0 refills | Status: DC
  Filled 2022-05-14: qty 60, 15d supply, fill #0

## 2022-05-19 ENCOUNTER — Ambulatory Visit: Payer: BC Managed Care – PPO | Admitting: Family Medicine

## 2022-06-08 ENCOUNTER — Other Ambulatory Visit (HOSPITAL_COMMUNITY): Payer: Self-pay

## 2022-06-08 MED ORDER — OXYCODONE HCL 10 MG PO TABS
10.0000 mg | ORAL_TABLET | Freq: Four times a day (QID) | ORAL | 0 refills | Status: AC | PRN
  Filled 2022-06-08: qty 60, 15d supply, fill #0

## 2022-06-08 MED ORDER — AMLODIPINE BESYLATE 10 MG PO TABS
10.0000 mg | ORAL_TABLET | Freq: Every day | ORAL | 0 refills | Status: AC
  Filled 2022-06-08: qty 90, 90d supply, fill #0

## 2022-06-08 MED ORDER — BENZONATATE 100 MG PO CAPS
100.0000 mg | ORAL_CAPSULE | Freq: Three times a day (TID) | ORAL | 0 refills | Status: DC
  Filled 2022-06-08: qty 30, 10d supply, fill #0

## 2022-06-29 ENCOUNTER — Other Ambulatory Visit (HOSPITAL_COMMUNITY): Payer: Self-pay

## 2022-06-29 MED ORDER — OXYCODONE HCL 10 MG PO TABS
10.0000 mg | ORAL_TABLET | Freq: Four times a day (QID) | ORAL | 0 refills | Status: AC
  Filled 2022-06-29: qty 60, 15d supply, fill #0

## 2022-06-29 MED ORDER — BENZONATATE 100 MG PO CAPS
100.0000 mg | ORAL_CAPSULE | Freq: Three times a day (TID) | ORAL | 0 refills | Status: AC
  Filled 2022-06-29: qty 30, 10d supply, fill #0

## 2022-10-02 ENCOUNTER — Other Ambulatory Visit (HOSPITAL_COMMUNITY): Payer: Self-pay

## 2022-10-09 ENCOUNTER — Other Ambulatory Visit (HOSPITAL_COMMUNITY): Payer: Self-pay | Admitting: Nurse Practitioner

## 2022-10-09 DIAGNOSIS — G894 Chronic pain syndrome: Secondary | ICD-10-CM

## 2022-10-09 DIAGNOSIS — Z862 Personal history of diseases of the blood and blood-forming organs and certain disorders involving the immune mechanism: Secondary | ICD-10-CM

## 2022-10-09 DIAGNOSIS — M5451 Vertebrogenic low back pain: Secondary | ICD-10-CM

## 2022-10-23 ENCOUNTER — Ambulatory Visit
Admission: RE | Admit: 2022-10-23 | Discharge: 2022-10-23 | Disposition: A | Discharge status: Home / Self Care | Payer: BC Managed Care – PPO | Source: Ambulatory Visit | Attending: Nurse Practitioner | Admitting: Nurse Practitioner

## 2022-10-23 ENCOUNTER — Ambulatory Visit
Admission: RE | Admit: 2022-10-23 | Discharge: 2022-10-23 | Disposition: A | Discharge status: Home / Self Care | Payer: BC Managed Care – PPO | Attending: Nurse Practitioner | Admitting: Nurse Practitioner

## 2022-10-23 DIAGNOSIS — Z862 Personal history of diseases of the blood and blood-forming organs and certain disorders involving the immune mechanism: Secondary | ICD-10-CM

## 2022-10-23 DIAGNOSIS — M5451 Vertebrogenic low back pain: Secondary | ICD-10-CM

## 2022-10-23 DIAGNOSIS — G894 Chronic pain syndrome: Secondary | ICD-10-CM

## 2022-11-10 ENCOUNTER — Other Ambulatory Visit (HOSPITAL_COMMUNITY): Payer: Self-pay

## 2023-02-03 ENCOUNTER — Other Ambulatory Visit (HOSPITAL_COMMUNITY): Payer: Self-pay

## 2023-02-03 MED ORDER — AMLODIPINE BESYLATE 10 MG PO TABS
10.0000 mg | ORAL_TABLET | Freq: Every day | ORAL | 0 refills | Status: AC
  Filled 2023-02-03: qty 90, 90d supply, fill #0

## 2023-02-04 ENCOUNTER — Other Ambulatory Visit: Payer: Self-pay

## 2023-04-27 LAB — HM DIABETES EYE EXAM

## 2023-09-09 ENCOUNTER — Other Ambulatory Visit: Payer: Self-pay | Admitting: Nurse Practitioner

## 2023-09-09 ENCOUNTER — Ambulatory Visit
Admission: RE | Admit: 2023-09-09 | Discharge: 2023-09-09 | Disposition: A | Discharge status: Home / Self Care | Payer: BC Managed Care – PPO | Source: Ambulatory Visit | Attending: Nurse Practitioner | Admitting: Nurse Practitioner

## 2023-09-09 DIAGNOSIS — R2241 Localized swelling, mass and lump, right lower limb: Secondary | ICD-10-CM | POA: Diagnosis present

## 2023-09-09 DIAGNOSIS — D57219 Sickle-cell/Hb-C disease with crisis, unspecified: Secondary | ICD-10-CM | POA: Diagnosis present

## 2024-05-01 ENCOUNTER — Telehealth: Payer: Self-pay

## 2024-05-01 ENCOUNTER — Encounter

## 2024-05-01 NOTE — Telephone Encounter (Signed)
 Multiple attempts made via phone calls to complete PV appointment.  No answer and no voicemail.  If patient does not call back before the end of today to reschedule his previsit appointment, his colonoscopy which is scheduled 05/15/24 will be canceled.

## 2024-05-09 ENCOUNTER — Ambulatory Visit (AMBULATORY_SURGERY_CENTER): Admitting: *Deleted

## 2024-05-09 VITALS — Ht 69.0 in | Wt 155.0 lb

## 2024-05-09 DIAGNOSIS — Z1211 Encounter for screening for malignant neoplasm of colon: Secondary | ICD-10-CM

## 2024-05-09 MED ORDER — NA SULFATE-K SULFATE-MG SULF 17.5-3.13-1.6 GM/177ML PO SOLN: 1.0000 | Freq: Once | ORAL | 0 refills | Status: AC

## 2024-05-09 NOTE — Progress Notes (Signed)
 Pt's name and DOB verified at the beginning of the pre-visit with 2 identifiers  Pt denies any difficulty with ambulating,sitting, laying down or rolling side to side   Pt uses ambulation assistance device or has issues with mobiiity  Pt has no issues moving head neck or swallowing  No egg or soy allergy known to patient   Patient denies ever being intubated  No FH of Malignant Hyperthermia  Pt is not on home 02   Pt is not on blood thinners   Pt denies issues with constipation   Pt is not on dialysis  Pt denise any abnormal heart rhythms   Pt denies any upcoming cardiac testing  Chart not reviewed by CRNA prior to PV  Visit by phone  Pt states weight 155 lb Instructed not to smoke Marijauna day before and day of procedure/ Pt states he will  Instructions reviewed. Pt given  both [facility] main # and MD on call # prior to instructions.   Informed pt to come in at the time discussed and is shown on PV instructions. Pt informed that they are coming in i.e. cloths change.,IV placement , Consent signing and meeting CRNA. Pt states understanding after  given opportunity to ask questions t after all  instructions given.  Pt instructed to use Single[employer].com or GoodRx for a price reduction on prep  Instructed pt to review instructions again prior to procedure and call main # given if has any questions.. Pt states they will.  Instructed pt where to find PV instructions in My Chart  Instructed pt on all aspects of written instructions including med holds clothing to wear and foods to eat and not eat as well as after procedure legal restrictions and to cal MD on call if needed.. Pt states understanding.

## 2024-05-11 ENCOUNTER — Encounter: Payer: Self-pay | Admitting: Gastroenterology

## 2024-05-15 ENCOUNTER — Encounter: Admitting: Internal Medicine

## 2024-05-15 ENCOUNTER — Encounter: Payer: Self-pay | Admitting: Gastroenterology

## 2024-05-15 ENCOUNTER — Ambulatory Visit: Admitting: Gastroenterology

## 2024-05-15 VITALS — BP 138/93 | HR 68 | Temp 98.2°F | Resp 14 | Ht 69.0 in | Wt 155.0 lb

## 2024-05-15 DIAGNOSIS — D122 Benign neoplasm of ascending colon: Secondary | ICD-10-CM

## 2024-05-15 DIAGNOSIS — D128 Benign neoplasm of rectum: Secondary | ICD-10-CM

## 2024-05-15 DIAGNOSIS — D127 Benign neoplasm of rectosigmoid junction: Secondary | ICD-10-CM | POA: Diagnosis not present

## 2024-05-15 DIAGNOSIS — K648 Other hemorrhoids: Secondary | ICD-10-CM | POA: Diagnosis not present

## 2024-05-15 DIAGNOSIS — K621 Rectal polyp: Secondary | ICD-10-CM

## 2024-05-15 DIAGNOSIS — D120 Benign neoplasm of cecum: Secondary | ICD-10-CM

## 2024-05-15 DIAGNOSIS — K5730 Diverticulosis of large intestine without perforation or abscess without bleeding: Secondary | ICD-10-CM

## 2024-05-15 DIAGNOSIS — K641 Second degree hemorrhoids: Secondary | ICD-10-CM

## 2024-05-15 DIAGNOSIS — Z1211 Encounter for screening for malignant neoplasm of colon: Secondary | ICD-10-CM | POA: Diagnosis present

## 2024-05-15 MED ORDER — SODIUM CHLORIDE 0.9 % IV SOLN: 500.0000 mL | Freq: Once | INTRAVENOUS | Status: DC

## 2024-05-15 NOTE — Patient Instructions (Addendum)
-   Resume previous diet - Continue present medications. - Await pathology results - Repeat colonoscopy in 6 months for surveillance after piecemeal polypectomy.   YOU HAD AN ENDOSCOPIC PROCEDURE TODAY AT THE  ENDOSCOPY CENTER:   Refer to the procedure report that was given to you for any specific questions about what was found during the examination.  If the procedure report does not answer your questions, please call your gastroenterologist to clarify.  If you requested that your care partner not be given the details of your procedure findings, then the procedure report has been included in a sealed envelope for you to review at your convenience later.  YOU SHOULD EXPECT: Some feelings of bloating in the abdomen. Passage of more gas than usual.  Walking can help get rid of the air that was put into your GI tract during the procedure and reduce the bloating. If you had a lower endoscopy (such as a colonoscopy or flexible sigmoidoscopy) you may notice spotting of blood in your stool or on the toilet paper. If you underwent a bowel prep for your procedure, you may not have a normal bowel movement for a few days.  Please Note:  You might notice some irritation and congestion in your nose or some drainage.  This is from the oxygen used during your procedure.  There is no need for concern and it should clear up in a day or so.  SYMPTOMS TO REPORT IMMEDIATELY:  Following lower endoscopy (colonoscopy or flexible sigmoidoscopy):  Excessive amounts of blood in the stool  Significant tenderness or worsening of abdominal pains  Swelling of the abdomen that is new, acute  Fever of 100F or higher  For urgent or emergent issues, a gastroenterologist can be reached at any hour by calling [phone]. Do not use MyChart messaging for urgent concerns.    DIET:  We do recommend a small meal at first, but then you may proceed to your regular diet.  Drink plenty of fluids but you should avoid alcoholic  beverages for 24 hours.  ACTIVITY:  You should plan to take it easy for the rest of today and you should NOT DRIVE or use heavy machinery until tomorrow (because of the sedation medicines used during the test).    FOLLOW UP: Our staff will call the number listed on your records the next business day following your procedure.  We will call around 7:15- 8:00 am to check on you and address any questions or concerns that you may have regarding the information given to you following your procedure. If we do not reach you, we will leave a message.     If any biopsies were taken you will be contacted by phone or by letter within the next 1-3 weeks.  Please call us  at [phone] if you have not heard about the biopsies in 3 weeks.    SIGNATURES/CONFIDENTIALITY: You and/or your care partner have signed paperwork which will be entered into your electronic medical record.  These signatures attest to the fact that that the information above on your After Visit Summary has been reviewed and is understood.  Full responsibility of the confidentiality of this discharge information lies with you and/or your care-partner.
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
## 2024-05-15 NOTE — Progress Notes (Signed)
 Vss nad trans to pacu

## 2024-05-15 NOTE — Op Note (Signed)
 [facility] Patient Name: Ronald Hurst Procedure Date: 05/15/2024 1:57 PM MRN: [id number] Endoscopist: Sandor Flatter , MD, [id number] Age: 48 Referring MD:  Date of Birth: 1975-10-25 Gender: Male Account #: [id number] Procedure:                Colonoscopy with Endoscopic Mucosal Resection (EMR)                            and cold snare polypectomy Indications:              Screening for colorectal malignant neoplasm, This                            is the patient's first colonoscopy Medicines:                Monitored Anesthesia Care Procedure:                Pre-Anesthesia Assessment:                           - Prior to the procedure, a History and Physical                            was performed, and patient medications and                            allergies were reviewed. The patient's tolerance of                            previous anesthesia was also reviewed. The risks                            and benefits of the procedure and the sedation                            options and risks were discussed with the patient.                            All questions were answered, and informed consent                            was obtained. Prior Anticoagulants: The patient has                            taken no anticoagulant or antiplatelet agents. ASA                            Grade Assessment: II - A patient with mild systemic                            disease. After reviewing the risks and benefits,                            the patient was deemed in satisfactory condition to  undergo the procedure.                           After obtaining informed consent, the colonoscope                            was passed under direct vision. Throughout the                            procedure, the patient's blood pressure, pulse, and                            oxygen saturations were monitored continuously. The                             Olympus CF-HQ190L ([id number]) Colonoscope was                            introduced through the anus and advanced to the the                            terminal ileum. The colonoscopy was performed                            without difficulty. The patient tolerated the                            procedure well. The quality of the bowel                            preparation was good. The terminal ileum, ileocecal                            valve, appendiceal orifice, and rectum were                            photographed. Scope In: 2:07:07 PM Scope Out: 2:51:02 PM Scope Withdrawal Time: 0 hours 40 minutes 43 seconds  Total Procedure Duration: 0 hours 43 minutes 55 seconds  Findings:                 The perianal and digital rectal examinations were                            normal.                           A 3 mm polyp was found in the cecum. The polyp was                            sessile. The polyp was removed with a cold snare.                            Resection and retrieval were complete. Estimated  blood loss was minimal.                           A 20 mm polyp was found in the proximal ascending                            colon. The polyp was sessile. The polyp was located                            adjacent to the ileocecal valve, but did not                            involve the valve. The ICV was otherwise normal                            appearing and patent. The polyp was removed via                            Endoscopic Mucosal Resection (EMR) with a saline                            injection-lift technique using a hot snare.                            Resection was completed via piecemeal technique and                            retrieval were complete. Estimated blood loss was                            minimal.                           Two mucous-capped and sessile polyps were found in                            the ascending colon. The  polyps were 3 to 5 mm in                            size. These polyps were removed with a cold snare.                            Resection and retrieval were complete. Estimated                            blood loss was minimal.                           Eight sessile polyps were found in the rectum and                            recto-sigmoid colon. The polyps were 2 to 4 mm in  size. These polyps were removed with a cold snare.                            Resection and retrieval were complete. Estimated                            blood loss was minimal.                           A 10 mm adenomatous appearing polyp was found in                            the distal rectum. The polyp was sessile. The polyp                            was removed with a cold snare. Resection and                            retrieval were complete. Estimated blood loss was                            minimal.                           Multiple small-mouthed diverticula were found in                            the sigmoid colon, descending colon and ascending                            colon.                           Non-bleeding internal hemorrhoids were found during                            retroflexion. The hemorrhoids were small.                           The terminal ileum appeared normal. Complications:            No immediate complications. Estimated Blood Loss:     Estimated blood loss was minimal. Impression:               - One 3 mm polyp in the cecum, removed with a cold                            snare. Resected and retrieved.                           - One 20 mm polyp in the proximal ascending colon,                            removed via Endoscopic Mucosal Resection (EMR)  using injection-lift and a hot snare. Resected and                            retrieved.                           - Two 3 to 5 mm polyps in the ascending colon,                             removed with a cold snare. Resected and retrieved.                           - Eight 2 to 4 mm polyps in the rectum and at the                            recto-sigmoid colon, removed with a cold snare.                            Resected and retrieved.                           - One 10 mm polyp in the distal rectum, removed                            with a cold snare. Resected and retrieved.                           - Diverticulosis in the sigmoid colon, in the                            descending colon and in the ascending colon.                           - Non-bleeding internal hemorrhoids.                           - The examined portion of the ileum was normal. Recommendation:           - Patient has a contact number available for                            emergencies. The signs and symptoms of potential                            delayed complications were discussed with the                            patient. Return to normal activities tomorrow.                            Written discharge instructions were provided to the                            patient.                           -  Resume previous diet.                           - Continue present medications.                           - Await pathology results.                           - Repeat colonoscopy in 6 months for surveillance                            after piecemeal polypectomy.                           - Return to GI office PRN. Sandor Flatter, MD 05/15/2024 3:01:36 PM

## 2024-05-15 NOTE — Progress Notes (Signed)
 Pt's states no medical or surgical changes since previsit or office visit.

## 2024-05-16 ENCOUNTER — Telehealth: Payer: Self-pay

## 2024-05-16 NOTE — Telephone Encounter (Signed)
  Follow up Call-     05/15/2024    1:17 PM  Call back number  Post procedure Call Back phone  # [phone]  Permission to leave phone message Yes     Patient questions:  Do you have a fever, pain , or abdominal swelling? No. Pain Score  0 *  Have you tolerated food without any problems? Yes.    Have you been able to return to your normal activities? Yes.    Do you have any questions about your discharge instructions: Diet   No. Medications  No. Follow up visit  No.  Do you have questions or concerns about your Care? No.  Actions: * If pain score is 4 or above: No action needed, pain <4.

## 2024-05-18 ENCOUNTER — Ambulatory Visit: Payer: Self-pay | Admitting: Gastroenterology

## 2024-05-18 LAB — SURGICAL PATHOLOGY
